# Patient Record
Sex: Male | Born: 1990 | Race: White | Hispanic: No | Marital: Single | Smoking: Never smoker
Health system: Southern US, Community
[De-identification: ages and names within clinical notes are randomized; demographics above are authoritative.]

## PROBLEM LIST (undated history)

## (undated) DIAGNOSIS — F988 Other specified behavioral and emotional disorders with onset usually occurring in childhood and adolescence: Secondary | ICD-10-CM

## (undated) DIAGNOSIS — L709 Acne, unspecified: Secondary | ICD-10-CM

## (undated) HISTORY — DX: Other specified behavioral and emotional disorders with onset usually occurring in childhood and adolescence: F98.8

## (undated) HISTORY — DX: Acne, unspecified: L70.9

---
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
## 2010-01-01 ENCOUNTER — Ambulatory Visit: Payer: Self-pay | Admitting: Internal Medicine

## 2010-01-01 DIAGNOSIS — J020 Streptococcal pharyngitis: Secondary | ICD-10-CM | POA: Insufficient documentation

## 2010-04-02 NOTE — Progress Notes (Signed)
  Phone Note Refill Request Message from:  Fax from Pharmacy on August 05, 2009 2:53 PM  Refills Requested: Medication #1:  ADDERALL XR 30 MG  CP24 1 by mouth once daily - to fill November 1 can pt have refill. No ov since 2009, please Advise  Initial call taken by: Ami Bullins CMA,  August 05, 2009 2:53 PM  Follow-up for Phone Call        ok to ref sch ov Follow-up by: Tresa Garter MD,  August 05, 2009 3:07 PM  Additional Follow-up for Phone Call Additional follow up Details #1::        Rx printed for MD to sign.  Additional Follow-up by: Ami Bullins CMA,  August 05, 2009 3:20 PM    Additional Follow-up for Phone Call Additional follow up Details #2::    see office visit on 08/07/2009 Follow-up by: Ami Bullins CMA,  August 27, 2009 8:54 AM  Prescriptions: ADDERALL XR 30 MG  CP24 (AMPHETAMINE-DEXTROAMPHETAMINE) 1 by mouth once daily - to fill December 30, 2008  #30 x 0   Entered by:   Ami Bullins CMA   Authorized by:   Tresa Garter MD   Signed by:   Bill Salinas CMA on 08/05/2009   Method used:   Print then Give to Patient   RxID:   [id number]

## 2010-04-02 NOTE — Assessment & Plan Note (Signed)
Summary: DR AVP PT/NO SLOT-COUGH-SORE THROAT/WHITE THROAT BLISTERS-STC   Vital Signs:  Patient profile:   20 year old male Height:      67 inches Weight:      184 pounds BMI:     28.92 O2 Sat:      96 % on Room air Temp:     98.5 degrees F oral Pulse rate:   68 / minute Pulse rhythm:   regular Resp:     16 per minute BP sitting:   106 / 80  (left arm) Cuff size:   large  Vitals Entered By: Rock Nephew CMA (January 01, 2010 10:06 AM)  Nutrition Counseling: Patient's BMI is greater than 25 and therefore counseled on weight management options.  O2 Flow:  Room air CC: Patient c/o sore throat, Bilateral ear pain x 2day, URI symptoms Is Patient Diabetic? No Pain Assessment Patient in pain? yes     Location: throat Intensity: 3  Does patient need assistance? Functional Status Self care Ambulation Normal   Primary Care Deeanna Beightol:  Georgina Quint Plotnikov MD  CC:  Patient c/o sore throat, Bilateral ear pain x 2day, and URI symptoms.  History of Present Illness:  URI Symptoms      This is a 20 year old man who presents with URI symptoms.  The symptoms began 2 days ago.  The severity is described as moderate.  The patient reports sore throat, but denies nasal congestion, clear nasal discharge, purulent nasal discharge, dry cough, productive cough, earache, and sick contacts.  Associated symptoms include low-grade fever (<100.5 degrees), use of an antipyretic, and response to antipyretic.  The patient denies stiff neck, dyspnea, wheezing, rash, vomiting, and diarrhea.  The patient denies headache, muscle aches, and severe fatigue.  Risk factors for Strep sinusitis include absence of cough.  The patient denies the following risk factors for Strep sinusitis: unilateral facial pain, unilateral nasal discharge, poor response to decongestant, and double sickening.    Preventive Screening-Counseling & Management  Alcohol-Tobacco     Alcohol drinks/day: 0     Alcohol Counseling: not  indicated; patient does not drink     Smoking Status: never     Tobacco Counseling: not indicated; no tobacco use  Hep-HIV-STD-Contraception     Hepatitis Risk: no risk noted     HIV Risk: no risk noted     STD Risk: no risk noted  Medications Prior to Update: 1)  Adderall Xr 30 Mg  Cp24 (Amphetamine-Dextroamphetamine) .Marland Kitchen.. 1 By Mouth Once Daily Please,  Fill On or After   10/07/09         . Ok To Fill 1-2 Day Earlier When The Month Has 31 Days in It. 2)  Omeprazole 20 Mg Cpdr (Omeprazole) .Marland Kitchen.. 1 By Mouth Once Daily 3)  Azithromycin 250 Mg Tabs (Azithromycin) .... 2po Qd For 1 Day, Then 1po Qd For 4days, Then Stop 4)  Mytussin Ac 100-10 Mg/68ml Syrp (Guaifenesin-Codeine) .... 5-10 Ml By Mouth Qid As Needed For Cough  Current Medications (verified): 1)  Adderall Xr 30 Mg  Cp24 (Amphetamine-Dextroamphetamine) .Marland Kitchen.. 1 By Mouth Once Daily Please,  Fill On or After   10/07/09         . Ok To Fill 1-2 Day Earlier When The Month Has 31 Days in It. 2)  Omeprazole 20 Mg Cpdr (Omeprazole) .Marland Kitchen.. 1 By Mouth Once Daily 3)  Amoxicillin 500 Mg Cap (Amoxicillin) .... Take 1 Capsule By Mouth Three Times A Day X 10 Days  Allergies (  verified): No Known Drug Allergies  Past History:  Past Medical History: Last updated: 04/17/2007 ADD Acne  Past Surgical History: Last updated: 03/24/2009 Denies surgical history  Family History: Last updated: 04/17/2007 F COPD  Social History: Last updated: 08/07/2009 Parents are divorced; lives with: M .  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 F COPD  Social History: Reviewed history from 08/07/2009 and no changes required. Parents are divorced; lives with: M .  Single Never  Smoked Alcohol use-no Drug use-no Regular exercise-golf Occupation: Architectural technologist Business  Review of Systems  The patient denies anorexia, fever, hoarseness, headaches, hemoptysis, abdominal pain, suspicious skin lesions, and enlarged lymph nodes.    Physical Exam  General:  alert, well-developed, well-nourished, well-hydrated, appropriate dress, normal appearance, healthy-appearing, and good hygiene.   Head:  normocephalic, atraumatic, no abnormalities observed, and no abnormalities palpated.   Eyes:  vision grossly intact and no injection.   Ears:  R ear normal and L ear normal.   Mouth:  good dentition, no exudates, no posterior lymphoid hypertrophy, no postnasal drip, no pharyngeal crowing, no lesions, no erosions, no tongue abnormalities, no leukoplakia, no petechiae, and pharyngeal erythema.   Neck:  supple, full ROM, no masses, no thyromegaly, no JVD, normal carotid upstroke, no carotid bruits, no cervical lymphadenopathy, and no neck tenderness.   Lungs:  normal respiratory effort, no intercostal retractions, no accessory muscle use, normal breath sounds, no dullness, no fremitus, no crackles, and no wheezes.   Heart:  normal rate, regular rhythm, no murmur, no gallop, no rub, and no JVD.   Abdomen:  soft, non-tender, normal bowel sounds, no distention, no masses, no guarding, no rigidity, no rebound tenderness, no abdominal hernia, no inguinal hernia, no hepatomegaly, and no splenomegaly.   Msk:  No deformity or scoliosis noted of thoracic or lumbar spine.   Pulses:  R and L carotid,radial,femoral,dorsalis pedis and posterior tibial pulses are full and equal bilaterally Extremities:  No clubbing, cyanosis, edema, or deformity noted with normal full range of motion of all joints.   Neurologic:  No cranial nerve deficits noted. Station and gait are normal. Plantar reflexes are down-going bilaterally. DTRs are symmetrical throughout. Sensory, motor and coordinative  functions appear intact. Skin:  turgor normal, color normal, no rashes, no suspicious lesions, no ecchymoses, no petechiae, no purpura, no ulcerations, and no edema.   Cervical Nodes:  no anterior cervical adenopathy and no posterior cervical adenopathy.   Axillary Nodes:  no R axillary adenopathy and no L axillary adenopathy.   Inguinal Nodes:  no R inguinal adenopathy and no L inguinal adenopathy.   Psych:  Cognition and judgment appear intact. Alert and cooperative with normal attention span and concentration. No apparent delusions, illusions, hallucinations   Impression & Recommendations:  Problem # 1:  STREP THROAT (ICD-034.0) Assessment New  The following medications were removed from the medication list:    Azithromycin 250 Mg Tabs (Azithromycin) .Marland Kitchen... 2po qd for 1 day, then 1po qd for 4days, then stop His updated medication list for this problem includes:    Amoxicillin 500 Mg Cap (Amoxicillin) .Marland Kitchen... Take 1 capsule by mouth three times a day x 10 days  Instructed to complete antibiotics and call if not improved in 48 hours.   Orders: Rapid Strep (  21308)  Problem # 2:  ADD (ICD-314.00) Assessment: Unchanged  Complete Medication List: 1)  Adderall Xr 30 Mg Cp24 (Amphetamine-dextroamphetamine) .Marland Kitchen.. 1 by mouth once daily please,  fill on or after   10/07/09         . ok to fill 1-2 day earlier when the month has 31 days in it. 2)  Omeprazole 20 Mg Cpdr (Omeprazole) .Marland Kitchen.. 1 by mouth once daily 3)  Amoxicillin 500 Mg Cap (Amoxicillin) .... Take 1 capsule by mouth three times a day x 10 days  Patient Instructions: 1)  Please schedule a follow-up appointment in 2 weeks. 2)  Get plenty of rest, drink lots of clear liquids, and use Tylenol or Ibuprofen for fever and comfort. Return in 7-10 days if you're not better:sooner if you're feeling worse. 3)  Take your antibiotic as prescribed until ALL of it is gone, but stop if you develop a rash or swelling and contact our office as soon as  possible. Prescriptions: ADDERALL XR 30 MG  CP24 (AMPHETAMINE-DEXTROAMPHETAMINE) 1 by mouth once daily Please,  fill on or after   10/07/09         . OK to fill 1-2 day earlier when the month has 31 days in it.  #30 x 0   Entered and Authorized by:   Etta Grandchild MD   Signed by:   Etta Grandchild MD on 01/01/2010   Method used:   Print then Give to Patient   RxID:   [id number] AMOXICILLIN 500 MG CAP (AMOXICILLIN) Take 1 capsule by mouth three times a day X 10 days  #30 x 0   Entered and Authorized by:   Etta Grandchild MD   Signed by:   Etta Grandchild MD on 01/01/2010   Method used:   Electronically to        [facility] [id number] (retail)       [address] [place], [place]  [address]       Ph: [phone]       Fax: [phone]   RxID:   [id number]    Orders Added: 1)  Est. Patient Level IV [01601] 2)  Rapid Strep [09323]    Laboratory Results    Other Tests  Rapid Strep: positive

## 2010-04-02 NOTE — Assessment & Plan Note (Signed)
Summary: follow up for rx refill-lb   Vital Signs:  Patient profile:   20 year old male Height:      67 inches Weight:      183.25 pounds BMI:     28.80 O2 Sat:      98 % on Room air Temp:     97.7 degrees F oral Pulse rate:   55 / minute BP sitting:   116 / 72  (left arm) Cuff size:   regular  Vitals Entered By: Lucious Groves (August 07, 2009 8:14 AM)  O2 Flow:  Room air CC: Follow-up visit/rx refill./kb Is Patient Diabetic? No Pain Assessment Patient in pain? no      Comments Patient notes that the only med he is taking is Adderall./kb   Primary Care Provider:  Tresa Garter MD  CC:  Follow-up visit/rx refill./kb.  History of Present Illness: F/u ADD  Current Medications (verified): 1)  Adderall Xr 30 Mg  Cp24 (Amphetamine-Dextroamphetamine) .Marland Kitchen.. 1 By Mouth Once Daily - To Fill December 30, 2008 2)  Omeprazole 20 Mg Cpdr (Omeprazole) .Marland Kitchen.. 1 By Mouth Once Daily 3)  Azithromycin 250 Mg Tabs (Azithromycin) .... 2po Qd For 1 Day, Then 1po Qd For 4days, Then Stop 4)  Mytussin Ac 100-10 Mg/32ml Syrp (Guaifenesin-Codeine) .... 5-10 Ml By Mouth Qid As Needed For Cough  Allergies (verified): No Known Drug Allergies  Past History:  Past Medical History: Last updated: 04/17/2007 ADD Acne  Past Surgical History: Last updated: 03/24/2009 Denies surgical history  Family History: Last updated: 04/17/2007 F COPD  Social History: Parents are divorced; lives with: M .  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         No LBP  Physical Exam  General:  alert, well-developed, well-nourished, well-hydrated, appropriate dress, normal appearance, healthy-appearing, cooperative to examination, good hygiene, and not overweight-appearing.   Eyes:  No corneal or conjunctival inflammation noted. EOMI. Perrla.    Mouth:  Oral mucosa and oropharynx without lesions or exudates.  Teeth in good repair. Lungs:  normal respiratory effort, no intercostal retractions, no accessory muscle use, normal breath sounds, no dullness, no crackles, and no wheezes.   Heart:  normal rate, regular rhythm, no murmur, no gallop, no rub, and no JVD.   Abdomen:  soft, non-tender, normal bowel sounds, no distention, no masses, no guarding, no rigidity, no hepatomegaly, and no splenomegaly.   Msk:  Marked decrease in LS spine ROM; no scoliosis, normal gait, normal posture Neurologic:  No cranial nerve deficits noted. Station and gait are normal. Plantar reflexes are down-going bilaterally. DTRs are symmetrical throughout. Sensory, motor and coordinative functions appear intact. Skin:  Intact without suspicious lesions or rashes Psych:  Cognition and judgment appear intact. Alert and cooperative with normal attention span and concentration. No apparent delusions, illusions, hallucinations   Impression & Recommendations:  Problem # 1:  ADD (ICD-314.00) Assessment Comment Only On prescription drug  therapy - Rx given  Problem # 2:  Stiff LS spine Assessment: Unchanged Use stretching and balance exercises that I have provided (15 min. or longer every day)   Complete Medication List: 1)  Adderall Xr 30 Mg Cp24 (Amphetamine-dextroamphetamine) .Marland Kitchen.. 1 by mouth once daily please,  fill on or after   10/07/09         . ok to fill 1-2 day earlier when the  month has 31 days in it. 2)  Omeprazole 20 Mg Cpdr (Omeprazole) .Marland Kitchen.. 1 by mouth once daily 3)  Azithromycin 250 Mg Tabs (Azithromycin) .... 2po qd for 1 day, then 1po qd for 4days, then stop 4)  Mytussin Ac 100-10 Mg/63ml Syrp (Guaifenesin-codeine) .... 5-10 ml by mouth qid as needed for cough  Patient Instructions: 1)  Please schedule a follow-up appointment in 6 months well w/labs. 2)  Use stretching and balance exercises that I have provided (15 min. or longer every  day) Prescriptions: ADDERALL XR 30 MG  CP24 (AMPHETAMINE-DEXTROAMPHETAMINE) 1 by mouth once daily Please,  fill on or after   10/07/09         . OK to fill 1-2 day earlier when the month has 31 days in it.  #30 x 0   Entered and Authorized by:   Tresa Garter MD   Signed by:   Tresa Garter MD on 08/07/2009   Method used:   Print then Give to Patient   RxID:   [id number] ADDERALL XR 30 MG  CP24 (AMPHETAMINE-DEXTROAMPHETAMINE) 1 by mouth once daily Please,  fill on or after   09/06/09         . OK to fill 1-2 day earlier when the month has 31 days in it.  #30 x 0   Entered and Authorized by:   Tresa Garter MD   Signed by:   Tresa Garter MD on 08/07/2009   Method used:   Print then Give to Patient   RxID:   [id number] ADDERALL XR 30 MG  CP24 (AMPHETAMINE-DEXTROAMPHETAMINE) 1 by mouth once daily  #30 x 0   Entered and Authorized by:   Tresa Garter MD   Signed by:   Tresa Garter MD on 08/07/2009   Method used:   Print then Give to Patient   RxID:   [id number]

## 2010-04-02 NOTE — Assessment & Plan Note (Signed)
Summary: sinus,cough/plot/cd  #  RE'D PER FATHER   Vital Signs:  Patient profile:   20 year old male Height:      67 inches Weight:      197 pounds O2 Sat:      98 % on Room air Temp:     97.9 degrees F oral Pulse rate:   80 / minute Pulse rhythm:   regular Resp:     16 per minute BP sitting:   106 / 80  (right arm)  O2 Flow:  Room air  Primary Care Provider:  Tresa Garter MD  CC:  URI symptoms.  History of Present Illness:  URI Symptoms      This is an 20 year old man who presents with URI symptoms.  The symptoms began 5 days ago.  The severity is described as moderate.  The patient reports purulent nasal discharge, sore throat, productive cough, and sick contacts, but denies earache.  The patient denies fever, stiff neck, dyspnea, wheezing, rash, vomiting, diarrhea, and use of an antipyretic.  The patient denies headache, muscle aches, and severe fatigue.  Risk factors for Strep sinusitis include poor response to decongestant and double sickening.  The patient denies the following risk factors for Strep sinusitis: unilateral facial pain, unilateral nasal discharge, Strep exposure, tender adenopathy, and absence of cough.    Preventive Screening-Counseling & Management  Alcohol-Tobacco     Alcohol drinks/day: 0     Smoking Status: never  Caffeine-Diet-Exercise     Does Patient Exercise: no  Hep-HIV-STD-Contraception     Hepatitis Risk: no risk noted     HIV Risk: no risk noted     STD Risk: no risk noted      Drug Use:  no.    Medications Prior to Update: 1)  Adderall Xr 30 Mg  Cp24 (Amphetamine-Dextroamphetamine) .Marland Kitchen.. 1 By Mouth Once Daily - To Fill December 30, 2008 2)  Omeprazole 20 Mg Cpdr (Omeprazole) .Marland Kitchen.. 1 By Mouth Once Daily 3)  Azithromycin 250 Mg Tabs (Azithromycin) .... 2po Qd For 1 Day, Then 1po Qd For 4days, Then Stop  Allergies (verified): No Known Drug Allergies  Past History:  Past Medical History: Reviewed history from 04/17/2007 and no  changes required. ADD Acne  Past Surgical History: Denies surgical history  Family History: Reviewed history from 04/17/2007 and no changes required. F COPD  Social History: Parents are divorced; lives with: M .  Single Never Smoked Alcohol use-no Drug use-no Regular exercise-no Hepatitis Risk:  no risk noted HIV Risk:  no risk noted STD Risk:  no risk noted Drug Use:  no Does Patient Exercise:  no  Review of Systems  The patient denies anorexia, fever, hoarseness, chest pain, hemoptysis, abdominal pain, difficulty walking, depression, enlarged lymph nodes, angioedema, and breast masses.    Physical Exam  General:  alert, well-developed, well-nourished, well-hydrated, appropriate dress, normal appearance, healthy-appearing, cooperative to examination, good hygiene, and overweight-appearing.   Head:  normocephalic, atraumatic, no abnormalities observed, and no abnormalities palpated.   Ears:  R ear normal and L ear normal.   Nose:  nasal discharge, mucosal erythema, and mucosal edema.  no nasal discharge, no airflow obstruction, no intranasal foreign body, no nasal polyps, no nasal mucosal lesions, no mucosal friability, no active bleeding or clots, no sinus percussion tenderness, no septum abnormalities, nasal dischargemucosal pallor, mucosal erythema, and mucosal edema.   Mouth:  no exudates, no posterior lymphoid hypertrophy, no postnasal drip, no pharyngeal crowing, no lesions, and pharyngeal  erythema.   Neck:  supple, full ROM, no masses, no thyromegaly, no JVD, no carotid bruits, and no cervical lymphadenopathy.   Lungs:  normal respiratory effort, no intercostal retractions, no accessory muscle use, normal breath sounds, no dullness, no crackles, and no wheezes.   Heart:  normal rate, regular rhythm, no murmur, no gallop, no rub, and no JVD.   Abdomen:  soft, non-tender, normal bowel sounds, no distention, no masses, no guarding, no rigidity, no hepatomegaly, and no  splenomegaly.   Msk:  no scoliosis, normal gait, normal posture Pulses:  R and L carotid,radial,femoral,dorsalis pedis and posterior tibial pulses are full and equal bilaterally Extremities:  no edema, no ulcers  Neurologic:  Neurologic exam grossly intact  Skin:  turgor normal, color normal, no rashes, no suspicious lesions, no ecchymoses, no petechiae, and no edema.   Cervical Nodes:  no anterior cervical adenopathy and no posterior cervical adenopathy.   Axillary Nodes:  no R axillary adenopathy and no L axillary adenopathy.   Inguinal Nodes:  no R inguinal adenopathy and no L inguinal adenopathy.   Psych:  alert and cooperative  Not depressed Notr suicidal or homicidal   Impression & Recommendations:  Problem # 1:  SINUSITIS- ACUTE-NOS (ICD-461.9) Assessment Deteriorated  His updated medication list for this problem includes:    Azithromycin 250 Mg Tabs (Azithromycin) .Marland Kitchen... 2po qd for 1 day, then 1po qd for 4days, then stop    Mytussin Ac 100-10 Mg/71ml Syrp (Guaifenesin-codeine) .Marland Kitchen... 5-10 ml by mouth qid as needed for cough  Problem # 2:  ADD (ICD-314.00) Assessment: Unchanged  Complete Medication List: 1)  Adderall Xr 30 Mg Cp24 (Amphetamine-dextroamphetamine) .Marland Kitchen.. 1 by mouth once daily - to fill December 30, 2008 2)  Omeprazole 20 Mg Cpdr (Omeprazole) .Marland Kitchen.. 1 by mouth once daily 3)  Azithromycin 250 Mg Tabs (Azithromycin) .... 2po qd for 1 day, then 1po qd for 4days, then stop 4)  Mytussin Ac 100-10 Mg/23ml Syrp (Guaifenesin-codeine) .... 5-10 ml by mouth qid as needed for cough  Patient Instructions: 1)  Please schedule a follow-up appointment in 1 month. 2)  Take your antibiotic as prescribed until ALL of it is gone, but stop if you develop a rash or swelling and contact our office as soon as possible. 3)  Acute bronchitis symptoms for less than 10 days are not helped by antibiotics. take over the counter cough medications. call if no improvment in  5-7 days, sooner if  increasing cough, fever, or new symptoms( shortness of breath, chest pain). Prescriptions: ADDERALL XR 30 MG  CP24 (AMPHETAMINE-DEXTROAMPHETAMINE) 1 by mouth once daily - to fill December 30, 2008  #30 x 0   Entered and Authorized by:   Etta Grandchild MD   Signed by:   Etta Grandchild MD on 03/24/2009   Method used:   Print then Give to Patient   RxID:   [id number] MYTUSSIN AC 100-10 MG/5ML SYRP (GUAIFENESIN-CODEINE) 5-10 ml by mouth QID as needed for cough  #6 ounces x 0   Entered and Authorized by:   Etta Grandchild MD   Signed by:   Etta Grandchild MD on 03/24/2009   Method used:   Print then Give to Patient   RxID:   [id number] AZITHROMYCIN 250 MG TABS (AZITHROMYCIN) 2po qd for 1 day, then 1po qd for 4days, then stop  #6 x 1   Entered and Authorized by:   Etta Grandchild MD   Signed by:   Etta Grandchild  MD on 03/24/2009   Method used:   Print then Give to Patient   RxID:   [id number]

## 2010-07-08 ENCOUNTER — Telehealth: Payer: Self-pay | Admitting: *Deleted

## 2010-07-08 MED ORDER — AMPHETAMINE-DEXTROAMPHET ER 30 MG PO CP24: 30.0000 mg | ORAL_CAPSULE | ORAL | Status: DC

## 2010-07-08 NOTE — Telephone Encounter (Signed)
Thx

## 2010-07-08 NOTE — Telephone Encounter (Signed)
Needs RFs, OK per MD 2 rx's and pt needs f/u OV. Rx's given to pt's father

## 2010-08-21 ENCOUNTER — Encounter: Payer: Self-pay | Admitting: Internal Medicine

## 2010-08-24 ENCOUNTER — Ambulatory Visit: Payer: Self-pay | Admitting: Internal Medicine

## 2011-01-25 ENCOUNTER — Telehealth: Payer: Self-pay | Admitting: *Deleted

## 2011-01-25 NOTE — Telephone Encounter (Signed)
OK to fill this prescription with additional refills x0, sch OV. Thank you!

## 2011-01-25 NOTE — Telephone Encounter (Signed)
Pt's father left vm req Rf on Adderall. He states he has been told pt must be seen but the schedulers told him no openings for some time. Please advise.

## 2011-01-26 MED ORDER — AMPHETAMINE-DEXTROAMPHET ER 30 MG PO CP24: 30.0000 mg | ORAL_CAPSULE | ORAL | Status: DC

## 2011-01-26 NOTE — Telephone Encounter (Signed)
Patient dad notified per MD

## 2011-07-06 ENCOUNTER — Telehealth: Payer: Self-pay | Admitting: Internal Medicine

## 2011-07-06 NOTE — Telephone Encounter (Signed)
He needs to be seen Thx

## 2011-07-06 NOTE — Telephone Encounter (Signed)
Father requesting adderall prescription  And pt will pick up prescription--

## 2011-07-07 NOTE — Telephone Encounter (Signed)
Noted. Thx.

## 2011-07-07 NOTE — Telephone Encounter (Signed)
Pt have appt August 04, 2011

## 2011-07-08 NOTE — Telephone Encounter (Signed)
Pt's father calling for Refill on Adderall. Please advise.

## 2011-07-09 ENCOUNTER — Ambulatory Visit (INDEPENDENT_AMBULATORY_CARE_PROVIDER_SITE_OTHER): Payer: BC Managed Care – PPO | Admitting: Internal Medicine

## 2011-07-09 ENCOUNTER — Encounter: Payer: Self-pay | Admitting: Internal Medicine

## 2011-07-09 VITALS — BP 98/72 | HR 76 | Temp 98.3°F | Resp 16 | Ht 69.0 in | Wt 180.0 lb

## 2011-07-09 DIAGNOSIS — F988 Other specified behavioral and emotional disorders with onset usually occurring in childhood and adolescence: Secondary | ICD-10-CM

## 2011-07-09 DIAGNOSIS — K219 Gastro-esophageal reflux disease without esophagitis: Secondary | ICD-10-CM

## 2011-07-09 DIAGNOSIS — M25569 Pain in unspecified knee: Secondary | ICD-10-CM

## 2011-07-09 MED ORDER — AMPHETAMINE-DEXTROAMPHETAMINE 10 MG PO TABS: 10.0000 mg | ORAL_TABLET | Freq: Two times a day (BID) | ORAL | Status: DC

## 2011-07-09 NOTE — Telephone Encounter (Signed)
His las ov was 08/07/2009  The pt called this morning still requesting the medicine.  He states he needs the medicine before his scheduled appt.

## 2011-07-09 NOTE — Progress Notes (Signed)
  Subjective:    Patient ID: Gary Jennings, male    DOB: February 26, 1991, 21 y.o.   MRN: [id number]  HPI  F/u ADD, GERD C/o sleeping difficulties when taking adderral xr C/o R knee pain w/running- wants to start running again  Wt Readings from Last 3 Encounters:  07/09/11 180 lb (81.647 kg)  01/01/10 184 lb (83.462 kg) (84.08%*)  08/07/09 183 lb 4 oz (83.122 kg) (84.60%*)   * Growth percentiles are based on CDC 2-20 Years data.      Review of Systems  Constitutional: Negative for appetite change, fatigue and unexpected weight change.  HENT: Negative for nosebleeds, congestion, sore throat, sneezing, trouble swallowing and neck pain.   Eyes: Negative for itching and visual disturbance.  Respiratory: Negative for cough.   Cardiovascular: Negative for chest pain, palpitations and leg swelling.  Gastrointestinal: Negative for nausea, diarrhea, blood in stool and abdominal distention.  Genitourinary: Negative for frequency and hematuria.  Musculoskeletal: Negative for back pain, joint swelling and gait problem.  Skin: Negative for rash.  Neurological: Negative for dizziness, tremors, speech difficulty and weakness.  Psychiatric/Behavioral: Positive for decreased concentration. Negative for suicidal ideas, behavioral problems, sleep disturbance, self-injury, dysphoric mood and agitation. The patient is not nervous/anxious and is not hyperactive.        Objective:   Physical Exam  Constitutional: He is oriented to person, place, and time. He appears well-developed and well-nourished. No distress.  HENT:  Mouth/Throat: Oropharynx is clear and moist.  Eyes: Conjunctivae are normal. Pupils are equal, round, and reactive to light.  Neck: Normal range of motion. No JVD present. No thyromegaly present.  Cardiovascular: Normal rate, regular rhythm, normal heart sounds and intact distal pulses.  Exam reveals no gallop and no friction rub.   No murmur heard. Pulmonary/Chest: Effort normal and  breath sounds normal. No respiratory distress. He has no wheezes. He exhibits no tenderness.  Abdominal: Soft. Bowel sounds are normal. He exhibits no distension and no mass. There is no tenderness. There is no rebound and no guarding.  Musculoskeletal: Normal range of motion. He exhibits no edema and no tenderness.       R knee w/crepitus w/ROM NT  Lymphadenopathy:    He has no cervical adenopathy.  Neurological: He is alert and oriented to person, place, and time. He has normal reflexes. No cranial nerve deficit. He exhibits normal muscle tone. Coordination normal.  Skin: Skin is warm and dry. No rash noted.  Psychiatric: He has a normal mood and affect. His behavior is normal. Judgment and thought content normal.          Assessment & Plan:

## 2011-07-09 NOTE — Telephone Encounter (Signed)
Lvmom for pt

## 2011-07-09 NOTE — Assessment & Plan Note (Signed)
Sports Med cons Dr Darrick Penna Ibuprofen prn Knee strengthening

## 2011-07-09 NOTE — Telephone Encounter (Signed)
Unable to fill w/o appt- he has not been seen in 2 years Thx

## 2011-07-09 NOTE — Assessment & Plan Note (Signed)
He stopped Prilosec

## 2011-07-09 NOTE — Telephone Encounter (Signed)
I could work him in 4 pm today Thx

## 2011-07-09 NOTE — Telephone Encounter (Signed)
When was his last ov? Thx

## 2011-07-09 NOTE — Assessment & Plan Note (Signed)
There is some difficulties sleeping w/Adderal XR Will switch to regular Adderral

## 2011-07-20 ENCOUNTER — Ambulatory Visit (INDEPENDENT_AMBULATORY_CARE_PROVIDER_SITE_OTHER): Payer: BC Managed Care – PPO | Admitting: Family Medicine

## 2011-07-20 VITALS — BP 100/70 | Ht 68.0 in | Wt 170.0 lb

## 2011-07-20 DIAGNOSIS — M25569 Pain in unspecified knee: Secondary | ICD-10-CM

## 2011-07-20 DIAGNOSIS — M25561 Pain in right knee: Secondary | ICD-10-CM | POA: Insufficient documentation

## 2011-07-20 MED ORDER — DICLOFENAC SODIUM 75 MG PO TBEC: 75.0000 mg | DELAYED_RELEASE_TABLET | Freq: Two times a day (BID) | ORAL | Status: DC

## 2011-07-20 NOTE — Assessment & Plan Note (Addendum)
Suspect overuse injury with patellar tendinitis and pes anserine tendinopathy from faster than recommended increase in running regimen. Patient's right leg is also 1.5 cm shorter than left, which may have made him more susceptible to injury.  Recommend patellar Helix, icing, limited running for the next couple of weeks, NSAID prn. Patient given heel inserts and heel lifts from right foot.  Follow-up in 4 weeks.   LL may contribute - if improves, then a custom orthotic for long-term use may be helpful.

## 2011-07-20 NOTE — Patient Instructions (Addendum)
You may walk for golf, however, avoiding running for the next 1-2 weeks.   Take the voltaren up to twice a day as needed. Icing after activity is important.   The patellar helix may help your knee pain.  Use the cushioned insoles and the heel lift for your right foot.   Follow-up in 1 month.

## 2011-07-20 NOTE — Progress Notes (Signed)
  Subjective:    Patient ID: Gary Jennings, male    DOB: 1991-01-13, 21 y.o.   MRN: [id number]  HPI This is a 21 year old healthy male presenting with worsening right knee pain.  He has had knee pain for the past 7-8 months.  Activity-wise, he used to run 2-3 miles a day and plays a lot of golf, but he usually uses the cart.  The pain is located below his patella. It hurts him the most the day after a run. He has stopped running for the past 2 weeks because of the pain.  He has not tried any medication or other treatment for the pain.  He denies any history of trauma to his knee.   Ramped up running rapidly with no formal program  Review of Systems    Objective:   Physical Exam GEN: WDWN, NAD, Non-toxic, Alert & Oriented x 3 HEENT: Atraumatic, Normocephalic.  Ears and Nose: No external deformity. EXTR: No clubbing/cyanosis/edema NEURO: Normal gait.  PSYCH: Normally interactive. Conversant. Not depressed or anxious appearing.  Calm demeanor.   Knee:  Right  Gait: Normal heel toe pattern ROM: intact Effusion: neg Echymosis or edema: none Patellar tendon mild tenderness also along pes anserine and PT distally Patellar grind: negative Medial and lateral patellar facet loading: negative medial and lateral joint lines:NT Mcmurray's neg Varus and valgus stress: stable Lachman: neg Ant and Post drawer: neg Hip abduction, IR, ER: WNL Hip flexion str: 5/5 Hip abd: 5/5 Quad: 5/5 VMO atrophy:No Hamstring concentric and eccentric: 5/5  Length length discrepancy: L 99.5 cm, R 98.0 cm from umbilicus to inferior medial malleolus  Ultrasound of left knee No tears or edema. Increased Doppler signal around pes anserine/patellar tendon.    Assessment & Plan:

## 2011-08-04 ENCOUNTER — Ambulatory Visit: Payer: Self-pay | Admitting: Internal Medicine

## 2011-11-16 ENCOUNTER — Telehealth: Payer: Self-pay | Admitting: Internal Medicine

## 2011-11-16 NOTE — Telephone Encounter (Signed)
OK OV in 3 mo Thx

## 2011-11-16 NOTE — Telephone Encounter (Signed)
Dad is requesting 3 mth prescription for pt's adderall---will pick prescription up from this office--dad  /don  [place] phone:  [phone]

## 2011-11-17 MED ORDER — AMPHETAMINE-DEXTROAMPHETAMINE 10 MG PO TABS: 10.0000 mg | ORAL_TABLET | Freq: Two times a day (BID) | ORAL | Status: DC

## 2011-11-17 NOTE — Telephone Encounter (Signed)
Pt scheduled visit for December, will come by today to pick up 3 month RX

## 2011-11-17 NOTE — Telephone Encounter (Signed)
RX printed and pt informed to pick up after 12pm

## 2011-12-03 ENCOUNTER — Ambulatory Visit (INDEPENDENT_AMBULATORY_CARE_PROVIDER_SITE_OTHER): Payer: BC Managed Care – PPO | Admitting: Internal Medicine

## 2011-12-03 ENCOUNTER — Other Ambulatory Visit (INDEPENDENT_AMBULATORY_CARE_PROVIDER_SITE_OTHER): Payer: BC Managed Care – PPO

## 2011-12-03 ENCOUNTER — Encounter: Payer: Self-pay | Admitting: Internal Medicine

## 2011-12-03 VITALS — BP 110/90 | HR 88 | Temp 98.1°F | Resp 16 | Wt 179.0 lb

## 2011-12-03 DIAGNOSIS — R1032 Left lower quadrant pain: Secondary | ICD-10-CM

## 2011-12-03 DIAGNOSIS — R0789 Other chest pain: Secondary | ICD-10-CM | POA: Insufficient documentation

## 2011-12-03 DIAGNOSIS — M25569 Pain in unspecified knee: Secondary | ICD-10-CM

## 2011-12-03 DIAGNOSIS — R071 Chest pain on breathing: Secondary | ICD-10-CM

## 2011-12-03 LAB — URINALYSIS
Specific Gravity, Urine: 1.025 (ref 1.000–1.030)
Total Protein, Urine: NEGATIVE
Urine Glucose: NEGATIVE
pH: 6 (ref 5.0–8.0)

## 2011-12-03 MED ORDER — CLINDAMYCIN PHOSPHATE 1 % EX LOTN: TOPICAL_LOTION | Freq: Two times a day (BID) | CUTANEOUS | Status: DC

## 2011-12-03 MED ORDER — DICLOFENAC SODIUM 75 MG PO TBEC: 75.0000 mg | DELAYED_RELEASE_TABLET | Freq: Two times a day (BID) | ORAL | Status: DC

## 2011-12-03 MED ORDER — AMPHETAMINE-DEXTROAMPHETAMINE 10 MG PO TABS: 10.0000 mg | ORAL_TABLET | Freq: Two times a day (BID) | ORAL | Status: DC

## 2011-12-03 NOTE — Patient Instructions (Signed)
Call if not well 

## 2011-12-03 NOTE — Progress Notes (Signed)
   Subjective:    Patient ID: Gary Jennings, male    DOB: [birth year], 21 y.o.   MRN: [id number]  Abdominal Pain This is a new problem. The current episode started in the past 7 days. The onset quality is gradual. The problem occurs intermittently. Duration: a few seconds. The problem has been gradually worsening. The pain is located in the LLQ and left flank. The pain is at a severity of 7/10. The pain is moderate. The quality of the pain is sharp. The abdominal pain radiates to the LLQ and left flank. Pertinent negatives include no diarrhea, frequency, hematuria or nausea. Exacerbated by: stretching. The pain is relieved by certain positions. He has tried nothing for the symptoms.    F/u ADD, GERD  C/o R knee pain w/running- wants to start running again  Wt Readings from Last 3 Encounters:  12/03/11 179 lb (81.194 kg)  07/20/11 170 lb (77.111 kg)  07/09/11 180 lb (81.647 kg)      Review of Systems  Constitutional: Negative for appetite change, fatigue and unexpected weight change.  HENT: Negative for nosebleeds, congestion, sore throat, sneezing, trouble swallowing and neck pain.   Eyes: Negative for itching and visual disturbance.  Respiratory: Negative for cough.   Cardiovascular: Negative for chest pain, palpitations and leg swelling.  Gastrointestinal: Positive for abdominal pain. Negative for nausea, diarrhea, blood in stool and abdominal distention.  Genitourinary: Negative for frequency and hematuria.  Musculoskeletal: Negative for back pain, joint swelling and gait problem.  Skin: Negative for rash.  Neurological: Negative for dizziness, tremors, speech difficulty and weakness.  Psychiatric/Behavioral: Positive for decreased concentration. Negative for suicidal ideas, behavioral problems, disturbed wake/sleep cycle, self-injury, dysphoric mood and agitation. The patient is not nervous/anxious and is not hyperactive.        Objective:   Physical Exam  Constitutional: He is  oriented to person, place, and time. He appears well-developed and well-nourished. No distress.  HENT:  Mouth/Throat: Oropharynx is clear and moist.  Eyes: Conjunctivae normal are normal. Pupils are equal, round, and reactive to light.  Neck: Normal range of motion. No JVD present. No thyromegaly present.  Cardiovascular: Normal rate, regular rhythm, normal heart sounds and intact distal pulses.  Exam reveals no gallop and no friction rub.   No murmur heard. Pulmonary/Chest: Effort normal and breath sounds normal. No respiratory distress. He has no wheezes. He exhibits no tenderness.  Abdominal: Soft. Bowel sounds are normal. He exhibits no distension and no mass. There is no tenderness. There is no rebound and no guarding.  Musculoskeletal: Normal range of motion. He exhibits no edema and no tenderness.       R knee w/crepitus w/ROM NT  Lymphadenopathy:    He has no cervical adenopathy.  Neurological: He is alert and oriented to person, place, and time. He has normal reflexes. No cranial nerve deficit. He exhibits normal muscle tone. Coordination normal.  Skin: Skin is warm and dry. No rash noted.  Psychiatric: He has a normal mood and affect. His behavior is normal. Judgment and thought content normal.  L lat chest wall and L abd wall is tender        Assessment & Plan:

## 2011-12-05 ENCOUNTER — Encounter: Payer: Self-pay | Admitting: Internal Medicine

## 2011-12-05 NOTE — Assessment & Plan Note (Signed)
Resolved

## 2011-12-05 NOTE — Assessment & Plan Note (Signed)
UA Call if not better NSAID Stretch and warm up prior to golfing 

## 2011-12-05 NOTE — Assessment & Plan Note (Signed)
UA Call if not better NSAID Stretch and warm up prior to golfing

## 2012-02-16 ENCOUNTER — Ambulatory Visit: Payer: BC Managed Care – PPO | Admitting: Internal Medicine

## 2012-02-16 DIAGNOSIS — Z0289 Encounter for other administrative examinations: Secondary | ICD-10-CM

## 2012-03-07 ENCOUNTER — Ambulatory Visit: Payer: BC Managed Care – PPO | Admitting: Internal Medicine

## 2012-05-29 ENCOUNTER — Ambulatory Visit: Payer: BC Managed Care – PPO | Admitting: Internal Medicine

## 2012-05-29 ENCOUNTER — Ambulatory Visit (INDEPENDENT_AMBULATORY_CARE_PROVIDER_SITE_OTHER): Payer: BC Managed Care – PPO | Admitting: Internal Medicine

## 2012-05-29 ENCOUNTER — Encounter: Payer: Self-pay | Admitting: Internal Medicine

## 2012-05-29 VITALS — BP 120/80 | HR 76 | Temp 98.1°F | Resp 16 | Wt 186.0 lb

## 2012-05-29 DIAGNOSIS — H109 Unspecified conjunctivitis: Secondary | ICD-10-CM | POA: Insufficient documentation

## 2012-05-29 DIAGNOSIS — J019 Acute sinusitis, unspecified: Secondary | ICD-10-CM

## 2012-05-29 DIAGNOSIS — N390 Urinary tract infection, site not specified: Secondary | ICD-10-CM

## 2012-05-29 DIAGNOSIS — F988 Other specified behavioral and emotional disorders with onset usually occurring in childhood and adolescence: Secondary | ICD-10-CM

## 2012-05-29 MED ORDER — AMPHETAMINE-DEXTROAMPHETAMINE 10 MG PO TABS: 10.0000 mg | ORAL_TABLET | Freq: Two times a day (BID) | ORAL | Status: DC

## 2012-05-29 MED ORDER — CEFUROXIME AXETIL 500 MG PO TABS: 500.0000 mg | ORAL_TABLET | Freq: Two times a day (BID) | ORAL | Status: DC

## 2012-05-29 MED ORDER — POLYMYXIN B-TRIMETHOPRIM 10000-0.1 UNIT/ML-% OP SOLN: 1.0000 [drp] | OPHTHALMIC | Status: DC

## 2012-05-29 NOTE — Progress Notes (Signed)
  Subjective:    Patient ID: Gary Jennings, male    DOB: [birth year], 22 y.o.   MRN: [id number]  C/o L pink eye. He stopped using contacts C/o cold sx's x 2 wks - brown nasal d/c  Conjunctivitis  The current episode started 5 to 7 days ago (L eye). The onset was sudden. The problem has been unchanged. The problem is moderate. Nothing relieves the symptoms. Pertinent negatives include no fever, no decreased vision, no double vision, no eye itching, no diarrhea, no nausea, no congestion, no sore throat, no neck pain, no cough, no rash, no eye discharge and no eye pain.    F/u ADD, GERD   Wt Readings from Last 3 Encounters:  05/29/12 186 lb (84.369 kg)  12/03/11 179 lb (81.194 kg)  07/20/11 170 lb (77.111 kg)      Review of Systems  Constitutional: Negative for fever, appetite change, fatigue and unexpected weight change.  HENT: Negative for nosebleeds, congestion, sore throat, sneezing, trouble swallowing and neck pain.   Eyes: Negative for double vision, pain, discharge, itching and visual disturbance.  Respiratory: Negative for cough.   Cardiovascular: Negative for chest pain, palpitations and leg swelling.  Gastrointestinal: Negative for nausea, diarrhea, blood in stool and abdominal distention.  Genitourinary: Negative for frequency and hematuria.  Musculoskeletal: Negative for back pain, joint swelling and gait problem.  Skin: Negative for rash.  Neurological: Negative for dizziness, tremors, speech difficulty and weakness.  Psychiatric/Behavioral: Positive for decreased concentration. Negative for suicidal ideas, behavioral problems, sleep disturbance, self-injury, dysphoric mood and agitation. The patient is not nervous/anxious and is not hyperactive.        Objective:   Physical Exam  Constitutional: He is oriented to person, place, and time. He appears well-developed and well-nourished. No distress.  HENT:  Mouth/Throat: No oropharyngeal exudate.  eryth nasal mucosa   Eyes: Conjunctivae are normal. Right eye exhibits no discharge. Left eye exhibits no discharge. No scleral icterus.  L eye is red, NT  Neck: Normal range of motion. No JVD present. No thyromegaly present.  Cardiovascular: Normal rate, regular rhythm, normal heart sounds and intact distal pulses.  Exam reveals no gallop and no friction rub.   No murmur heard. Pulmonary/Chest: Effort normal and breath sounds normal. No respiratory distress. He has no wheezes. He exhibits no tenderness.  Abdominal: Soft. Bowel sounds are normal. He exhibits no distension and no mass. There is no tenderness. There is no rebound and no guarding.  Musculoskeletal: Normal range of motion. He exhibits no edema and no tenderness.  R knee w/crepitus w/ROM NT  Lymphadenopathy:    He has no cervical adenopathy.  Neurological: He is alert and oriented to person, place, and time. He has normal reflexes. No cranial nerve deficit. He exhibits normal muscle tone. Coordination normal.  Skin: Skin is warm and dry. No rash noted.  Psychiatric: He has a normal mood and affect. His behavior is normal. Judgment and thought content normal.          Assessment & Plan:

## 2012-05-29 NOTE — Assessment & Plan Note (Signed)
3/14 L No contact lense in the eye Polytrim gtt

## 2012-05-29 NOTE — Assessment & Plan Note (Signed)
Ceftin x10 d 

## 2012-05-29 NOTE — Assessment & Plan Note (Signed)
Better Continue with current prescription therapy as reflected on the Med list.  

## 2012-05-30 ENCOUNTER — Telehealth: Payer: Self-pay | Admitting: Internal Medicine

## 2012-05-30 ENCOUNTER — Ambulatory Visit: Payer: BC Managed Care – PPO | Admitting: Family Medicine

## 2012-05-30 NOTE — Telephone Encounter (Signed)
Called to request different antibiotic be ordered for conjunctivis.  Seen in office 05/29/12.  Started on antibiotic eye drops and Cefuroxime BID.  Had vomiting and stomach cramps occurred within 2 hours of first dose so stopped taking it. No vomiting, stomach cramps or diarrhea today.  Please advise if new oral antibiotic is called in. [facility]/ [address] and [address]. Informed MD may not see message until 05/31/12.

## 2012-05-31 MED ORDER — AMOXICILLIN-POT CLAVULANATE 875-125 MG PO TABS: 1.0000 | ORAL_TABLET | Freq: Two times a day (BID) | ORAL | Status: DC

## 2012-05-31 NOTE — Telephone Encounter (Signed)
Sorry! -D/c Ceftin Take Augmentin - emailed Is the eye better? Thx

## 2012-06-01 ENCOUNTER — Ambulatory Visit (INDEPENDENT_AMBULATORY_CARE_PROVIDER_SITE_OTHER): Payer: BC Managed Care – PPO | Admitting: Family Medicine

## 2012-06-01 ENCOUNTER — Encounter: Payer: Self-pay | Admitting: Family Medicine

## 2012-06-01 VITALS — BP 125/80 | HR 68 | Ht 68.0 in | Wt 180.0 lb

## 2012-06-01 DIAGNOSIS — S93409A Sprain of unspecified ligament of unspecified ankle, initial encounter: Secondary | ICD-10-CM

## 2012-06-01 DIAGNOSIS — S93401A Sprain of unspecified ligament of right ankle, initial encounter: Secondary | ICD-10-CM | POA: Insufficient documentation

## 2012-06-01 NOTE — Assessment & Plan Note (Signed)
tenderness reproduced over ATFL consistent with sprain, likely grade 2.  Has an ASO at home - encouraged to use this.  Icing, nsaids, elevation.  Start ROM exercises and in a week add strengthening, balance exercises.  Anticipate 2-4 weeks for total improvement.  F/u in 4 weeks if still struggling.

## 2012-06-01 NOTE — Progress Notes (Signed)
  Subjective:    Patient ID: Gary Jennings, male    DOB: 12-19-1990, 22 y.o.   MRN: [id number]  PCP: Dr. Posey Rea  HPI 22 yo M here for right ankle injury.  Patient reports on 3/31 while playing basketball he shifted to the right and inverted his right ankle. Had to stop playing due to pain. + swelling and bruising. Improved since then. Pop was heard when injury occurred. Taking tylenol, icing. Limping still. About 8 years ago injured this ankle - was in a boot but does not recall a fracture. Had improved completely from this.  Past Medical History  Diagnosis Date  . ADD (attention deficit disorder)   . Acne     Current Outpatient Prescriptions on File Prior to Visit  Medication Sig Dispense Refill  . amoxicillin-clavulanate (AUGMENTIN) 875-125 MG per tablet Take 1 tablet by mouth 2 (two) times daily.  20 tablet  0  . amphetamine-dextroamphetamine (ADDERALL) 10 MG tablet Take 1 tablet (10 mg total) by mouth 2 (two) times daily. Please fill on or after 08/16/12  60 tablet  0  . clindamycin (CLEOCIN T) 1 % lotion Apply topically 2 (two) times daily. For acne  60 mL  2  . trimethoprim-polymyxin b (POLYTRIM) ophthalmic solution Place 1 drop into the left eye every 4 (four) hours.  10 mL  0   No current facility-administered medications on file prior to visit.    History reviewed. No pertinent past surgical history.  Allergies  Allergen Reactions  . Ceftin (Cefuroxime Axetil)     n/v    History   Social History  . Marital Status: Single    Spouse Name: N/A    Number of Children: N/A  . Years of Education: N/A   Occupational History  . Not on file.   Social History Main Topics  . Smoking status: Never Smoker   . Smokeless tobacco: Not on file  . Alcohol Use: No  . Drug Use: No  . Sexually Active: Not on file   Other Topics Concern  . Not on file   Social History Narrative   Parents are devoiced; lives with mother   Single   Regular exercise-[place]   Occupation: Therapist, [employer] and [employer]    Family History  Problem Relation Age of Onset  . COPD Father     BP 125/80  Pulse 68  Ht 5\' 8"  (1.727 m)  Wt 180 lb (81.647 kg)  BMI 27.38 kg/m2 Review of Systems See HPI above.    Objective:   Physical Exam Gen: NAD  R ankle: Mod swelling laterally.  No bruising, other deformity. Mild limitation ROM all directions. TTP over ATFL.  Minimal tenderness medial malleolus.  No lateral malleolus, base 5th, navicular, other ankle TTP.  No fibular head TTP. 2+ talar tilt.  1+ ant drawer.  Negative syndesmotic compression. Thompsons test negative. NV intact distally.    Assessment & Plan:  1. Right ankle sprain - tenderness reproduced over ATFL consistent with sprain, likely grade 2.  Has an ASO at home - encouraged to use this.  Icing, nsaids, elevation.  Start ROM exercises and in a week add strengthening, balance exercises.  Anticipate 2-4 weeks for total improvement.  F/u in 4 weeks if still struggling.

## 2012-06-01 NOTE — Patient Instructions (Addendum)
You have an ankle sprain. Ice the area for 15 minutes at a time, 3-4 times a day Take aleve 1-2 tabs twice a day with food as needed for pain and inflammation. Elevate above the level of your heart when possible Use laceup ankle brace that you have for support and compression (ok to use ace wrap instead). Come out of the boot/brace twice a day to do Up/down and alphabet exercises 2-3 sets of each. After a week add the strengthening exercises with theraband and the balance exercises we showed - do daily for next 6 weeks. Return to work should be completely fine on Monday. Start with putting and chipping Monday and every 3 days add in more clubs (short irons, then long irons, then woods). If not improving as expected, we may repeat x-rays or consider further testing like an MRI. Follow up with me in 4 weeks or as needed.

## 2012-06-01 NOTE — Telephone Encounter (Signed)
Pt informed of MD's advisement and new ABX for Augmentin via VM and to callback office with any questions/concerns.

## 2012-07-05 ENCOUNTER — Telehealth: Payer: Self-pay | Admitting: Internal Medicine

## 2012-07-05 NOTE — Telephone Encounter (Signed)
Emailed Psychologist, occupational, will notify father when correction has been made

## 2012-07-05 NOTE — Telephone Encounter (Signed)
The patient's dad came in and is hoping to get the 50.00 no show fee removed from his son's account.  He stated the son was unaware of the apt.  The dad is hoping for a call when the no-show fee is removed.  Thanks!   [phone] (Dad - Kara Dies)

## 2012-07-06 NOTE — Telephone Encounter (Signed)
Notified father that No-show has been removed

## 2013-01-19 ENCOUNTER — Encounter: Payer: Self-pay | Admitting: Internal Medicine

## 2013-01-19 ENCOUNTER — Ambulatory Visit (INDEPENDENT_AMBULATORY_CARE_PROVIDER_SITE_OTHER): Payer: BC Managed Care – PPO | Admitting: Internal Medicine

## 2013-01-19 VITALS — BP 100/70 | HR 76 | Temp 98.8°F | Resp 16 | Wt 177.0 lb

## 2013-01-19 DIAGNOSIS — F988 Other specified behavioral and emotional disorders with onset usually occurring in childhood and adolescence: Secondary | ICD-10-CM

## 2013-01-19 MED ORDER — AMPHETAMINE-DEXTROAMPHET ER 30 MG PO CP24: 30.0000 mg | ORAL_CAPSULE | ORAL | Status: DC

## 2013-01-19 NOTE — Progress Notes (Signed)
Pre visit review using our clinic review tool, if applicable. No additional management support is needed unless otherwise documented below in the visit note. 

## 2013-01-20 ENCOUNTER — Encounter: Payer: Self-pay | Admitting: Internal Medicine

## 2013-01-20 NOTE — Progress Notes (Signed)
  Subjective:     HPI  F/u ADD No sleeping difficulties now   Wt Readings from Last 3 Encounters:  01/19/13 177 lb (80.287 kg)  06/01/12 180 lb (81.647 kg)  05/29/12 186 lb (84.369 kg)    BP Readings from Last 3 Encounters:  01/19/13 100/70  06/01/12 125/80  05/29/12 120/80     Review of Systems  Constitutional: Negative for appetite change, fatigue and unexpected weight change.  HENT: Negative for congestion, nosebleeds, sneezing, sore throat and trouble swallowing.   Eyes: Negative for itching and visual disturbance.  Respiratory: Negative for cough.   Cardiovascular: Negative for chest pain, palpitations and leg swelling.  Gastrointestinal: Negative for nausea, diarrhea, blood in stool and abdominal distention.  Genitourinary: Negative for frequency and hematuria.  Musculoskeletal: Negative for back pain, gait problem, joint swelling and neck pain.  Skin: Negative for rash.  Neurological: Negative for dizziness, tremors, speech difficulty and weakness.  Psychiatric/Behavioral: Positive for decreased concentration. Negative for suicidal ideas, behavioral problems, sleep disturbance, self-injury, dysphoric mood and agitation. The patient is not nervous/anxious and is not hyperactive.        Objective:   Physical Exam  Constitutional: He is oriented to person, place, and time. He appears well-developed and well-nourished. No distress.  HENT:  Mouth/Throat: Oropharynx is clear and moist.  Eyes: Conjunctivae are normal. Pupils are equal, round, and reactive to light.  Neck: Normal range of motion. No JVD present. No thyromegaly present.  Cardiovascular: Normal rate, regular rhythm, normal heart sounds and intact distal pulses.  Exam reveals no gallop and no friction rub.   No murmur heard. Pulmonary/Chest: Effort normal and breath sounds normal. No respiratory distress. He has no wheezes. He exhibits no tenderness.  Abdominal: Soft. Bowel sounds are normal. He exhibits no  distension and no mass. There is no tenderness. There is no rebound and no guarding.  Musculoskeletal: Normal range of motion. He exhibits no edema and no tenderness.  R knee w/crepitus w/ROM NT  Lymphadenopathy:    He has no cervical adenopathy.  Neurological: He is alert and oriented to person, place, and time. He has normal reflexes. No cranial nerve deficit. He exhibits normal muscle tone. Coordination normal.  Skin: Skin is warm and dry. No rash noted.  Psychiatric: He has a normal mood and affect. His behavior is normal. Judgment and thought content normal.    No results found for this basename: WBC, HGB, HCT, PLT, GLUCOSE, CHOL, TRIG, HDL, LDLDIRECT, LDLCALC, ALT, AST, NA, K, CL, CREATININE, BUN, CO2, TSH, PSA, INR, GLUF, HGBA1C, MICROALBUR         Assessment & Plan:

## 2013-01-20 NOTE — Assessment & Plan Note (Signed)
Discussed See meds 

## 2014-08-23 ENCOUNTER — Encounter: Payer: Self-pay | Admitting: Internal Medicine

## 2014-08-23 ENCOUNTER — Ambulatory Visit (INDEPENDENT_AMBULATORY_CARE_PROVIDER_SITE_OTHER): Payer: BLUE CROSS/BLUE SHIELD | Admitting: Internal Medicine

## 2014-08-23 VITALS — BP 108/70 | HR 52 | Temp 97.8°F | Resp 16 | Wt 167.0 lb

## 2014-08-23 DIAGNOSIS — Z202 Contact with and (suspected) exposure to infections with a predominantly sexual mode of transmission: Secondary | ICD-10-CM | POA: Diagnosis not present

## 2014-08-23 MED ORDER — AZITHROMYCIN 250 MG PO TABS: ORAL_TABLET | ORAL | Status: DC

## 2014-08-23 MED ORDER — GEMIFLOXACIN MESYLATE 320 MG PO TABS: 320.0000 mg | ORAL_TABLET | Freq: Every day | ORAL | Status: DC

## 2014-08-23 NOTE — Progress Notes (Signed)
Pre visit review using our clinic review tool, if applicable. No additional management support is needed unless otherwise documented below in the visit note. 
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
## 2014-08-23 NOTE — Patient Instructions (Signed)
Fill the  prescription for antibiotic if fever,pain, penile discharge appear.

## 2014-09-11 ENCOUNTER — Telehealth: Payer: Self-pay | Admitting: Geriatric Medicine

## 2014-09-11 NOTE — Telephone Encounter (Signed)
Spoke to pt. Request confirmation that [place] pharmacy to [place] rx gemifloxacin. Pt confirmed that he did request. Next, pharmacy contacted us and stated that they were not able to fill the rx b/c they did not have the med available. Informed pt that he could call another pharmacy and check availability with them and have the rx transferred. Offered our assistance if he was unable to find a pharmacy at his out of town location to fill the rx.

## 2014-09-12 MED ORDER — LEVOFLOXACIN 500 MG PO TABS: 500.0000 mg | ORAL_TABLET | Freq: Every day | ORAL | Status: DC

## 2014-09-12 NOTE — Telephone Encounter (Signed)
Pt contacted. Erx done.

## 2014-09-12 NOTE — Telephone Encounter (Signed)
Change it to Levaquin 500 mg qd #7 thanks

## 2014-09-12 NOTE — Addendum Note (Signed)
Addended by: CAIRRIKIER, Dicie Edelen M on: 09/12/2014 04:22 PM   Modules accepted: Orders  

## 2014-09-12 NOTE — Telephone Encounter (Signed)
[facility] not [facility] order the Factive [facility]. The equivalents that I can find are quinolones type antibiotics. Contacted the pharmacy and they do have the ciprofloxacin.

## 2014-09-12 NOTE — Telephone Encounter (Signed)
Patient called stating that [facility] can't fill the prescription because of the drug being so old. And they don't carry it. Can you please prescribe something else for him Please advise patient

## 2014-09-13 ENCOUNTER — Telehealth: Payer: Self-pay

## 2014-09-13 NOTE — Telephone Encounter (Signed)
New rx sent to pt pharmacy preference. This one should be all done.

## 2014-09-13 NOTE — Telephone Encounter (Signed)
[place] but it has discontinued by manufacture. She would like to know if you want to send in something different or just cancel script and just have patient to take amoxicillin only?

## 2014-09-13 NOTE — Telephone Encounter (Signed)
Check with Stefanie; Levaquin was ordered yesterday

## 2015-08-06 ENCOUNTER — Encounter: Payer: BLUE CROSS/BLUE SHIELD | Admitting: Internal Medicine

## 2015-08-08 ENCOUNTER — Ambulatory Visit: Payer: BLUE CROSS/BLUE SHIELD | Admitting: Internal Medicine

## 2015-08-08 ENCOUNTER — Encounter: Payer: Self-pay | Admitting: Internal Medicine

## 2015-08-08 ENCOUNTER — Other Ambulatory Visit: Payer: BLUE CROSS/BLUE SHIELD

## 2015-08-08 ENCOUNTER — Ambulatory Visit (INDEPENDENT_AMBULATORY_CARE_PROVIDER_SITE_OTHER): Payer: BLUE CROSS/BLUE SHIELD | Admitting: Internal Medicine

## 2015-08-08 VITALS — BP 118/80 | HR 50 | Wt 173.0 lb

## 2015-08-08 DIAGNOSIS — B081 Molluscum contagiosum: Secondary | ICD-10-CM | POA: Insufficient documentation

## 2015-08-08 DIAGNOSIS — Z7721 Contact with and (suspected) exposure to potentially hazardous body fluids: Secondary | ICD-10-CM | POA: Diagnosis not present

## 2015-08-08 MED ORDER — CIPROFLOXACIN HCL 500 MG PO TABS: 500.0000 mg | ORAL_TABLET | Freq: Two times a day (BID) | ORAL | Status: DC

## 2015-08-08 MED ORDER — DOXYCYCLINE HYCLATE 100 MG PO TABS: 100.0000 mg | ORAL_TABLET | Freq: Two times a day (BID) | ORAL | Status: DC

## 2015-08-08 NOTE — Progress Notes (Signed)
Pre visit review using our clinic review tool, if applicable. No additional management support is needed unless otherwise documented below in the visit note. 

## 2015-08-08 NOTE — Assessment & Plan Note (Addendum)
Abd wall Discussed  Info/Rx options given

## 2015-08-08 NOTE — Assessment & Plan Note (Addendum)
Labs Cipro and Doxy Safe sex discussed

## 2015-08-09 ENCOUNTER — Encounter: Payer: Self-pay | Admitting: Internal Medicine

## 2015-08-09 LAB — GC/CHLAMYDIA PROBE AMP
CT PROBE, AMP APTIMA: NOT DETECTED
GC PROBE AMP APTIMA: NOT DETECTED

## 2015-08-09 NOTE — Progress Notes (Signed)
Subjective:  Patient ID: Gary Jennings, male    DOB: Dec 22, 1990  Age: 25 y.o. MRN: [id number]  CC: No chief complaint on file.   HPI Gary Jennings presents for chlamydia exposure in 3/17 (no sx's). Info per his ex-GF. C/o rash on abdomen  Outpatient Prescriptions Prior to Visit  Medication Sig Dispense Refill  . azithromycin (ZITHROMAX) 250 MG tablet 4 pills taken together (Patient not taking: Reported on 08/08/2015) 4 tablet 0  . gemifloxacin (FACTIVE) 320 MG tablet Take 1 tablet (320 mg total) by mouth daily. (Patient not taking: Reported on 08/08/2015) 1 tablet 0  . levofloxacin (LEVAQUIN) 500 MG tablet Take 1 tablet (500 mg total) by mouth daily. (Patient not taking: Reported on 08/08/2015) 7 tablet 0   No facility-administered medications prior to visit.    ROS Review of Systems  Constitutional: Negative for appetite change, fatigue and unexpected weight change.  HENT: Negative for congestion, nosebleeds, sneezing, sore throat and trouble swallowing.   Eyes: Negative for itching and visual disturbance.  Respiratory: Negative for cough.   Cardiovascular: Negative for chest pain, palpitations and leg swelling.  Gastrointestinal: Negative for nausea, diarrhea, blood in stool and abdominal distention.  Genitourinary: Negative for dysuria, urgency, frequency, hematuria, decreased urine volume, penile swelling, difficulty urinating, genital sores, penile pain and testicular pain.  Musculoskeletal: Negative for back pain, joint swelling, gait problem and neck pain.  Skin: Positive for rash.  Neurological: Negative for dizziness, tremors, speech difficulty and weakness.  Psychiatric/Behavioral: Negative for sleep disturbance, dysphoric mood and agitation. The patient is not nervous/anxious.     Objective:  BP 118/80 mmHg  Pulse 50  Wt 173 lb (78.472 kg)  SpO2 97%  BP Readings from Last 3 Encounters:  08/08/15 118/80  08/23/14 108/70  01/19/13 100/70    Wt Readings from Last 3  Encounters:  08/08/15 173 lb (78.472 kg)  08/23/14 167 lb (75.751 kg)  01/19/13 177 lb (80.287 kg)    Physical Exam  Constitutional: He is oriented to person, place, and time. He appears well-developed. No distress.  NAD  HENT:  Mouth/Throat: Oropharynx is clear and moist.  Eyes: Conjunctivae are normal. Pupils are equal, round, and reactive to light.  Neck: Normal range of motion. No JVD present. No thyromegaly present.  Cardiovascular: Normal rate, regular rhythm, normal heart sounds and intact distal pulses.  Exam reveals no gallop and no friction rub.   No murmur heard. Pulmonary/Chest: Effort normal and breath sounds normal. No respiratory distress. He has no wheezes. He has no rales. He exhibits no tenderness.  Abdominal: Soft. Bowel sounds are normal. He exhibits no distension and no mass. There is no tenderness. There is no rebound and no guarding.  Musculoskeletal: Normal range of motion. He exhibits no edema or tenderness.  Lymphadenopathy:    He has no cervical adenopathy.  Neurological: He is alert and oriented to person, place, and time. He has normal reflexes. No cranial nerve deficit. He exhibits normal muscle tone. He displays a negative Romberg sign. Coordination and gait normal.  Skin: Skin is warm and dry. Rash noted.  Psychiatric: He has a normal mood and affect. His behavior is normal. Judgment and thought content normal.    No results found for: WBC, HGB, HCT, PLT, GLUCOSE, CHOL, TRIG, HDL, LDLDIRECT, LDLCALC, ALT, AST, NA, K, CL, CREATININE, BUN, CO2, TSH, PSA, INR, GLUF, HGBA1C, MICROALBUR  No results found.  Assessment & Plan:   Diagnoses and all orders for this visit:  Exposure to  potentially hazardous body fluids -     HIV antibody; Future -     GC/chlamydia probe amp, urine; Future -     Hepatitis C antibody; Future  Molluscum contagiosum  Other orders -     doxycycline (VIBRA-TABS) 100 MG tablet; Take 1 tablet (100 mg total) by mouth 2 (two) times  daily. -     ciprofloxacin (CIPRO) 500 MG tablet; Take 1 tablet (500 mg total) by mouth 2 (two) times daily.   I have discontinued Mr. Verna CzechBranch's gemifloxacin, azithromycin, and levofloxacin. I am also having him start on doxycycline and ciprofloxacin.  Meds ordered this encounter  Medications  . doxycycline (VIBRA-TABS) 100 MG tablet    Sig: Take 1 tablet (100 mg total) by mouth 2 (two) times daily.    Dispense:  20 tablet    Refill:  0  . ciprofloxacin (CIPRO) 500 MG tablet    Sig: Take 1 tablet (500 mg total) by mouth 2 (two) times daily.    Dispense:  8 tablet    Refill:  0     Follow-up: No Follow-up on file.  Sonda PrimesAlex Navin Dogan, MD

## 2016-03-16 ENCOUNTER — Ambulatory Visit (INDEPENDENT_AMBULATORY_CARE_PROVIDER_SITE_OTHER): Payer: BLUE CROSS/BLUE SHIELD | Admitting: Internal Medicine

## 2016-03-16 ENCOUNTER — Encounter: Payer: Self-pay | Admitting: Internal Medicine

## 2016-03-16 DIAGNOSIS — F988 Other specified behavioral and emotional disorders with onset usually occurring in childhood and adolescence: Secondary | ICD-10-CM

## 2016-03-16 DIAGNOSIS — G8929 Other chronic pain: Secondary | ICD-10-CM | POA: Diagnosis not present

## 2016-03-16 DIAGNOSIS — L659 Nonscarring hair loss, unspecified: Secondary | ICD-10-CM | POA: Diagnosis not present

## 2016-03-16 DIAGNOSIS — M25561 Pain in right knee: Secondary | ICD-10-CM

## 2016-03-16 MED ORDER — VITAMIN D3 50 MCG (2000 UT) PO CAPS: 2000.0000 [IU] | ORAL_CAPSULE | Freq: Every day | ORAL | 3 refills | Status: DC

## 2016-03-16 MED ORDER — AMPHETAMINE-DEXTROAMPHET ER 15 MG PO CP24
15.0000 mg | ORAL_CAPSULE | ORAL | 0 refills | Status: DC
Start: 2016-03-16 — End: 2016-03-16

## 2016-03-16 MED ORDER — AMPHETAMINE-DEXTROAMPHET ER 15 MG PO CP24: 15.0000 mg | ORAL_CAPSULE | ORAL | 0 refills | Status: DC

## 2016-03-16 MED ORDER — AMPHETAMINE-DEXTROAMPHET ER 15 MG PO CP24
15.0000 mg | ORAL_CAPSULE | ORAL | 0 refills | Status: DC
Start: 2016-03-16 — End: 2016-05-14

## 2016-03-16 NOTE — Progress Notes (Signed)
Pre visit review using our clinic review tool, if applicable. No additional management support is needed unless otherwise documented below in the visit note. 

## 2016-03-16 NOTE — Assessment & Plan Note (Signed)
Derm ref B complex, Biotin, Vitamin D and Rogaine

## 2016-03-16 NOTE — Assessment & Plan Note (Signed)
?  loose body Dr Katrinka BlazingSmith ref

## 2016-03-16 NOTE — Patient Instructions (Signed)
Try B complex, Biotin, Vitamin D and Rogaine

## 2016-03-16 NOTE — Progress Notes (Signed)
Subjective:  Patient ID: Gary Jennings, male    DOB: 08/27/1990  Age: 26 y.o. MRN: [id number]  CC: No chief complaint on file.   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 C/o R knee loose body inside.Marland Kitchen.Marland Kitchen.C/o hair loss  Outpatient Medications Prior to Visit  Medication Sig Dispense Refill  . ciprofloxacin (CIPRO) 500 MG tablet Take 1 tablet (500 mg total) by mouth 2 (two) times daily. 8 tablet 0  . doxycycline (VIBRA-TABS) 100 MG tablet Take 1 tablet (100 mg total) by mouth 2 (two) times daily. 20 tablet 0   No facility-administered medications prior to visit.     ROS Review of Systems  Constitutional: Negative for appetite change, fatigue and unexpected weight change.  HENT: Negative for congestion, nosebleeds, sneezing, sore throat and trouble swallowing.   Eyes: Negative for itching and visual disturbance.  Respiratory: Negative for cough.   Cardiovascular: Negative for chest pain, palpitations and leg swelling.  Gastrointestinal: Negative for abdominal distention, blood in stool, diarrhea and nausea.  Genitourinary: Negative for frequency and hematuria.  Musculoskeletal: Negative for back pain, gait problem, joint swelling and neck pain.  Skin: Negative for rash.  Neurological: Negative for dizziness, tremors, speech difficulty and weakness.  Psychiatric/Behavioral: Negative for agitation, dysphoric mood and sleep disturbance. The patient is not nervous/anxious.     Objective:  BP 120/78   Pulse 67   Temp 97.9 F (36.6 C) (Oral)   Wt 179 lb (81.2 kg)   SpO2 97%   BMI 27.22 kg/m   BP Readings from Last 3 Encounters:  03/16/16 120/78  08/08/15 118/80  08/23/14 108/70    Wt Readings from Last 3 Encounters:  03/16/16 179 lb (81.2 kg)  08/08/15 173 lb (78.5 kg)  08/23/14 167 lb (75.8 kg)    Physical Exam  Constitutional: He is oriented to person, place, and time. He appears well-developed. No distress.  NAD    HENT:  Mouth/Throat: Oropharynx is clear and moist.  Eyes: Conjunctivae are normal. Pupils are equal, round, and reactive to light.  Neck: Normal range of motion. No JVD present. No thyromegaly present.  Cardiovascular: Normal rate, regular rhythm, normal heart sounds and intact distal pulses.  Exam reveals no gallop and no friction rub.   No murmur heard. Pulmonary/Chest: Effort normal and breath sounds normal. No respiratory distress. He has no wheezes. He has no rales. He exhibits no tenderness.  Abdominal: Soft. Bowel sounds are normal. He exhibits no distension and no mass. There is no tenderness. There is no rebound and no guarding.  Musculoskeletal: Normal range of motion. He exhibits no edema or tenderness.  Lymphadenopathy:    He has no cervical adenopathy.  Neurological: He is alert and oriented to person, place, and time. He has normal reflexes. No cranial nerve deficit. He exhibits normal muscle tone. He displays a negative Romberg sign. Coordination and gait normal.  Skin: Skin is warm and dry. No rash noted.  Psychiatric: He has a normal mood and affect. His behavior is normal. Judgment and thought content normal.  R knee is swollen  No results found for: WBC, HGB, HCT, PLT, GLUCOSE, CHOL, TRIG, HDL, LDLDIRECT, LDLCALC, ALT, AST, NA, K, CL, CREATININE, BUN, CO2, TSH, PSA, INR, GLUF, HGBA1C, MICROALBUR  No results found.  Assessment & Plan:   There are no diagnoses linked to this encounter. I have discontinued Mr. Verna CzechBranch's doxycycline and ciprofloxacin.  No orders of the  defined types were placed in this encounter.    Follow-up: No Follow-up on file.  Walker Kehr, MD

## 2016-05-14 ENCOUNTER — Telehealth: Payer: Self-pay | Admitting: *Deleted

## 2016-05-14 MED ORDER — AMPHETAMINE-DEXTROAMPHET ER 15 MG PO CP24: 15.0000 mg | ORAL_CAPSULE | ORAL | 0 refills | Status: DC

## 2016-05-14 NOTE — Telephone Encounter (Signed)
Notified pt MD ok to replace script. Will leave upfront for pick-up...Raechel Chute/lmb

## 2016-05-14 NOTE — Telephone Encounter (Signed)
Ok to restore the Rx Thx

## 2016-05-14 NOTE — Telephone Encounter (Signed)
Pt states he has miss placed his last script for his Adderrall requesting another script to have fill...Raechel Chute/lmb

## 2016-05-17 MED ORDER — AMPHETAMINE-DEXTROAMPHET ER 15 MG PO CP24: 15.0000 mg | ORAL_CAPSULE | ORAL | 0 refills | Status: DC

## 2016-05-17 NOTE — Telephone Encounter (Signed)
Could not find script from Friday reprinted script & gave to pt myself...Raechel Chute/lmb

## 2016-05-17 NOTE — Addendum Note (Signed)
Addended by: Deatra JamesBRAND, LUCY M on: 05/17/2016 11:37 AM   Modules accepted: Orders

## 2016-06-11 ENCOUNTER — Ambulatory Visit (INDEPENDENT_AMBULATORY_CARE_PROVIDER_SITE_OTHER): Payer: BLUE CROSS/BLUE SHIELD | Admitting: Nurse Practitioner

## 2016-06-11 ENCOUNTER — Encounter: Payer: Self-pay | Admitting: Nurse Practitioner

## 2016-06-11 VITALS — BP 122/78 | HR 50 | Temp 98.3°F | Ht 69.0 in | Wt 176.0 lb

## 2016-06-11 DIAGNOSIS — R253 Fasciculation: Secondary | ICD-10-CM | POA: Diagnosis not present

## 2016-06-11 DIAGNOSIS — F988 Other specified behavioral and emotional disorders with onset usually occurring in childhood and adolescence: Secondary | ICD-10-CM

## 2016-06-11 MED ORDER — AMPHETAMINE-DEXTROAMPHET ER 15 MG PO CP24: 15.0000 mg | ORAL_CAPSULE | ORAL | 0 refills | Status: DC

## 2016-06-11 NOTE — Progress Notes (Signed)
Pre visit review using our clinic review tool, if applicable. No additional management support is needed unless otherwise documented below in the visit note. 

## 2016-06-11 NOTE — Progress Notes (Signed)
   Subjective:  Patient ID: Gary Jennings, male    DOB: 04-23-1990  Age: 26 y.o. MRN: [id number]  CC: Follow-up (weird feeling on right side cheek,numbness going on 2 days. )   HPI  Mr. Gastelum describes facial sensation of muscle twitching on right cheek, sensation is intermittent, no specific trigger, resolves spontaneously, last for about , no other associated symptoms. Denies any recent URI or headache. Denies any anxiety. No FH of parkinsons dx or muscular dystrophy.  ADD: Also requesting for Adderral prescription. He does understand that he can not fill prescription till 06/14/16.  Outpatient Medications Prior to Visit  Medication Sig Dispense Refill  . amphetamine-dextroamphetamine (ADDERALL XR) 15 MG 24 hr capsule Take 1 capsule by mouth every morning. 30 capsule 0  . Cholecalciferol (VITAMIN D3) 2000 units capsule Take 1 capsule (2,000 Units total) by mouth daily. (Patient not taking: Reported on 06/11/2016) 100 capsule 3   No facility-administered medications prior to visit.     ROS See HPI  Objective:  BP 122/78   Pulse (!) 50   Temp 98.3 F (36.8 C)   Ht  (1.753 m)   Wt 176 lb (79.8 kg)   SpO2 98%   BMI 25.99 kg/m   BP Readings from Last 3 Encounters:  06/11/16 122/78  03/16/16 120/78  08/08/15 118/80    Wt Readings from Last 3 Encounters:  06/11/16 176 lb (79.8 kg)  03/16/16 179 lb (81.2 kg)  08/08/15 173 lb (78.5 kg)    Physical Exam  Constitutional: He is oriented to person, place, and time. No distress.  HENT:  Head: Normocephalic.  Right Ear: Tympanic membrane, external ear and ear canal normal.  Left Ear: Tympanic membrane, external ear and ear canal normal.  Nose: Nose normal.  Mouth/Throat: Uvula is midline, oropharynx is clear and moist and mucous membranes are normal. No oral lesions. No trismus in the jaw.  Eyes: Conjunctivae and EOM are normal. Pupils are equal, round, and reactive to light. No scleral icterus.  Neck: Normal  range of motion. Neck supple. No thyromegaly present.  Pulmonary/Chest: Effort normal and breath sounds normal.  Musculoskeletal: He exhibits no edema or tenderness.  Lymphadenopathy:    He has no cervical adenopathy.  Neurological: He is alert and oriented to person, place, and time. No cranial nerve deficit. Coordination normal.  Skin: Skin is warm and dry. No erythema.  Vitals reviewed.   No results found for: WBC, HGB, HCT, PLT, GLUCOSE, CHOL, TRIG, HDL, LDLDIRECT, LDLCALC, ALT, AST, NA, K, CL, CREATININE, BUN, CO2, TSH, PSA, INR, GLUF, HGBA1C, MICROALBUR  No results found.  Assessment & Plan:   Rhet was seen today for follow-up.  Diagnoses and all orders for this visit:  Attention deficit disorder (ADD) without hyperactivity -     amphetamine-dextroamphetamine (ADDERALL XR) 15 MG 24 hr capsule; Take 1 capsule by mouth every morning.  Muscle twitching   I am having Mr. Nauta maintain his Vitamin D3 and amphetamine-dextroamphetamine.  Meds ordered this encounter  Medications  . amphetamine-dextroamphetamine (ADDERALL XR) 15 MG 24 hr capsule    Sig: Take 1 capsule by mouth every morning.    Dispense:  30 capsule    Refill:  0    Please fill on or after 06/14/16    Order Specific Question:   Supervising Provider    Answer:   Tresa Garter [[id number]]    Follow-up: Return if symptoms worsen or fail to improve.  Alysia Penna, NP

## 2016-06-11 NOTE — Patient Instructions (Signed)
Transient Ischemic Attack °A transient ischemic attack (TIA) is a "warning stroke" that causes stroke-like symptoms. Unlike a stroke, a TIA does not cause permanent damage to the brain. The symptoms of a TIA can happen very fast and do not last long. It is important to know the symptoms of a TIA and what to do. This can help prevent a major stroke or death. °What are the causes? °A TIA is caused by a temporary blockage in an artery in the brain or neck (carotid artery). The blockage does not allow the brain to get the blood supply it needs and can cause different symptoms. The blockage can be caused by either: °· A blood clot. °· Fatty buildup (plaque) in a neck or brain artery. ° °What increases the risk? °· High blood pressure (hypertension). °· High cholesterol. °· Diabetes mellitus. °· Heart disease. °· The buildup of plaque in the blood vessels (peripheral artery disease or atherosclerosis). °· The buildup of plaque in the blood vessels that provide blood and oxygen to the brain (carotid artery stenosis). °· An abnormal heart rhythm (atrial fibrillation). °· Obesity. °· Using any tobacco products, including cigarettes, chewing tobacco, or electronic cigarettes. °· Taking oral contraceptives, especially in combination with using tobacco. °· Physical inactivity. °· A diet high in fats, salt (sodium), and calories. °· Excessive alcohol use. °· Use of illegal drugs (especially cocaine and methamphetamine). °· Being male. °· Being African American. °· Being over the age of 55 years. °· Family history of stroke. °· Previous history of blood clots, stroke, TIA, or heart attack. °· Sickle cell disease. °What are the signs or symptoms? °TIA symptoms are the same as a stroke but are temporary. These symptoms usually develop suddenly, or may be newly present upon waking from sleep: °· Sudden weakness or numbness of the face, arm, or leg, especially on one side of the body. °· Sudden trouble walking or difficulty moving  arms or legs. °· Sudden confusion. °· Sudden personality changes. °· Trouble speaking (aphasia) or understanding. °· Difficulty swallowing. °· Sudden trouble seeing in one or both eyes. °· Double vision. °· Dizziness. °· Loss of balance or coordination. °· Sudden severe headache with no known cause. °· Trouble reading or writing. °· Loss of bowel or bladder control. °· Loss of consciousness. ° °How is this diagnosed? °Your health care provider may be able to determine the presence or absence of a TIA based on your symptoms, history, and physical exam. CT scan of the brain is usually performed to help identify a TIA. Other tests may include: °· Electrocardiography (ECG). °· Continuous heart monitoring. °· Echocardiography. °· Carotid ultrasonography. °· MRI. °· A scan of the brain circulation. °· Blood tests. ° °How is this treated? °Since the symptoms of TIA are the same as a stroke, it is important to seek treatment as soon as possible. You may need a medicine to dissolve a blood clot (thrombolytic) if that is the cause of the TIA. This medicine cannot be given if too much time has passed. Treatment may also include: °· Rest, oxygen, fluids through an IV tube, and medicines to thin the blood (anticoagulants). °· Measures will be taken to prevent short-term and long-term complications, including infection from breathing foreign material into the lungs (aspiration pneumonia), blood clots in the legs, and falls. °· Procedures to either remove plaque in the carotid arteries or dilate carotid arteries that have narrowed due to plaque. Those procedures are: °? Carotid endarterectomy. °? Carotid angioplasty and stenting. °· Medicines   and diet may be used to address diabetes, high blood pressure, and other underlying risk factors. ° °Follow these instructions at home: °· Take medicines only as directed by your health care provider. Follow the directions carefully. Medicines may be used to control risk factors for a stroke.  Be sure you understand all your medicine instructions. °· You may be told to take aspirin or the anticoagulant warfarin. Warfarin needs to be taken exactly as instructed. °? Taking too much or too little warfarin is dangerous. Too much warfarin increases the risk of bleeding. Too little warfarin continues to allow the risk for blood clots. While taking warfarin, you will need to have regular blood tests to measure your blood clotting time. A PT blood test measures how long it takes for blood to clot. Your PT is used to calculate another value called an INR. Your PT and INR help your health care provider to adjust your dose of warfarin. The dose can change for many reasons. It is critically important that you take warfarin exactly as prescribed. °? Many foods, especially foods high in vitamin K can interfere with warfarin and affect the PT and INR. Foods high in vitamin K include spinach, kale, broccoli, cabbage, collard and turnip greens, Brussels sprouts, peas, cauliflower, seaweed, and parsley, as well as beef and pork liver, green tea, and soybean oil. You should eat a consistent amount of foods high in vitamin K. Avoid major changes in your diet, or notify your health care provider before changing your diet. Arrange a visit with a dietitian to answer your questions. °? Many medicines can interfere with warfarin and affect the PT and INR. You must tell your health care provider about any and all medicines you take; this includes all vitamins and supplements. Be especially cautious with aspirin and anti-inflammatory medicines. Do not take or discontinue any prescribed or over-the-counter medicine except on the advice of your health care provider or pharmacist. °? Warfarin can have side effects, such as excessive bruising or bleeding. You will need to hold pressure over cuts for longer than usual. Your health care provider or pharmacist will discuss other potential side effects. °? Avoid sports or activities that  may cause injury or bleeding. °? Be careful when shaving, flossing your teeth, or handling sharp objects. °? Alcohol can change the body's ability to handle warfarin. It is best to avoid alcoholic drinks or consume only very small amounts while taking warfarin. Notify your health care provider if you change your alcohol intake. °? Notify your dentist or other health care providers before procedures. °· Eat a diet that includes 5 or more servings of fruits and vegetables each day. This may reduce the risk of stroke. Certain diets may be prescribed to address high blood pressure, high cholesterol, diabetes, or obesity. °? A diet low in sodium, saturated fat, trans fat, and cholesterol is recommended to manage high blood pressure. °? A diet low in saturated fat, trans fat, and cholesterol, and high in fiber may control cholesterol levels. °? A controlled-carbohydrate, controlled-sugar diet is recommended to manage diabetes. °? A reduced-calorie diet that is low in sodium, saturated fat, trans fat, and cholesterol is recommended to manage obesity. °· Maintain a healthy weight. °· Stay physically active. It is recommended that you get at least 30 minutes of activity on most or all days. °· Do not use any tobacco products, including cigarettes, chewing tobacco, or electronic cigarettes. If you need help quitting, ask your health care provider. °· Limit alcohol intake   to no more than 1 drink per day for nonpregnant women and 2 drinks per day for men. One drink equals 12 ounces of beer, 5 ounces of wine, or 1½ ounces of hard liquor. °· Do not abuse drugs. °· A safe home environment is important to reduce the risk of falls. Your health care provider may arrange for specialists to evaluate your home. Having grab bars in the bedroom and bathroom is often important. Your health care provider may arrange for equipment to be used at home, such as raised toilets and a seat for the shower. °· Follow all instructions for follow-up  with your health care provider. This is very important. This includes any referrals and lab tests. Proper follow-up can prevent a stroke or another TIA from occurring. °How is this prevented? °The risk of a TIA can be decreased by appropriately treating high blood pressure, high cholesterol, diabetes, heart disease, and obesity, and by quitting smoking, limiting alcohol, and staying physically active. °Contact a health care provider if: °· You have personality changes. °· You have difficulty swallowing. °· You are seeing double. °· You have dizziness. °· You have a fever. °Get help right away if: °Any of the following symptoms may represent a serious problem that is an emergency. Do not wait to see if the symptoms will go away. Get medical help right away. Call your local emergency services (911 in [place] Do not drive yourself to the hospital. °· You have sudden weakness or numbness of the face, arm, or leg, especially on one side of the body. °· You have sudden trouble walking or difficulty moving arms or legs. °· You have sudden confusion. °· You have trouble speaking (aphasia) or understanding. °· You have sudden trouble seeing in one or both eyes. °· You have a loss of balance or coordination. °· You have a sudden, severe headache with no known cause. °· You have new chest pain or an irregular heartbeat. °· You have a partial or total loss of consciousness. ° °This information is not intended to replace advice given to you by your health care provider. Make sure you discuss any questions you have with your health care provider. °Document Released: 11/25/2004 Document Revised: 10/20/2015 Document Reviewed: 05/23/2013 °Elsevier Interactive Patient Education © 2017 [employer] ° °

## 2016-06-16 ENCOUNTER — Encounter: Payer: BLUE CROSS/BLUE SHIELD | Admitting: Internal Medicine

## 2016-06-29 ENCOUNTER — Encounter: Payer: Self-pay | Admitting: Internal Medicine

## 2016-06-29 ENCOUNTER — Ambulatory Visit (INDEPENDENT_AMBULATORY_CARE_PROVIDER_SITE_OTHER): Payer: BLUE CROSS/BLUE SHIELD | Admitting: Internal Medicine

## 2016-06-29 VITALS — BP 118/78 | HR 45 | Temp 98.1°F | Ht 69.0 in | Wt 175.0 lb

## 2016-06-29 DIAGNOSIS — F988 Other specified behavioral and emotional disorders with onset usually occurring in childhood and adolescence: Secondary | ICD-10-CM | POA: Diagnosis not present

## 2016-06-29 DIAGNOSIS — F419 Anxiety disorder, unspecified: Secondary | ICD-10-CM | POA: Diagnosis not present

## 2016-06-29 DIAGNOSIS — L559 Sunburn, unspecified: Secondary | ICD-10-CM

## 2016-06-29 DIAGNOSIS — Z0000 Encounter for general adult medical examination without abnormal findings: Secondary | ICD-10-CM

## 2016-06-29 MED ORDER — AMPHETAMINE-DEXTROAMPHET ER 15 MG PO CP24: 15.0000 mg | ORAL_CAPSULE | ORAL | 0 refills | Status: DC

## 2016-06-29 MED ORDER — TRIAMCINOLONE ACETONIDE 0.1 % EX OINT: 1.0000 "application " | TOPICAL_OINTMENT | Freq: Three times a day (TID) | CUTANEOUS | 1 refills | Status: DC

## 2016-06-29 NOTE — Progress Notes (Signed)
Pre visit review using our clinic review tool, if applicable. No additional management support is needed unless otherwise documented below in the visit note. 

## 2016-06-29 NOTE — Progress Notes (Signed)
Subjective:  Patient ID: Gary Jennings, male    DOB: 12-04-1990  Age: 26 y.o. MRN: [id number]  CC: No chief complaint on file.   HPI Gary Jennings presents for well exam C/o anxiety episodes F/u ADD C/o sunburn  Outpatient Medications Prior to Visit  Medication Sig Dispense Refill  . amphetamine-dextroamphetamine (ADDERALL XR) 15 MG 24 hr capsule Take 1 capsule by mouth every morning. 30 capsule 0  . Cholecalciferol (VITAMIN D3) 2000 units capsule Take 1 capsule (2,000 Units total) by mouth daily. 100 capsule 3   No facility-administered medications prior to visit.     ROS Review of Systems  Constitutional: Negative for appetite change, fatigue and unexpected weight change.  HENT: Negative for congestion, nosebleeds, sneezing, sore throat and trouble swallowing.   Eyes: Negative for itching and visual disturbance.  Respiratory: Negative for cough.   Cardiovascular: Negative for chest pain, palpitations and leg swelling.  Gastrointestinal: Negative for abdominal distention, blood in stool, diarrhea and nausea.  Genitourinary: Negative for frequency and hematuria.  Musculoskeletal: Negative for back pain, gait problem, joint swelling and neck pain.  Skin: Positive for color change. Negative for rash.  Neurological: Negative for dizziness, tremors, speech difficulty and weakness.  Psychiatric/Behavioral: Negative for agitation, dysphoric mood, sleep disturbance and suicidal ideas. The patient is nervous/anxious.     Objective:  BP 118/78 (BP Location: Left Arm, Patient Position: Sitting, Cuff Size: Normal)   Pulse (!) 45   Temp 98.1 F (36.7 C) (Oral)   Ht  (1.753 m)   Wt 175 lb (79.4 kg)   SpO2 99%   BMI 25.84 kg/m   BP Readings from Last 3 Encounters:  06/29/16 118/78  06/11/16 122/78  03/16/16 120/78    Wt Readings from Last 3 Encounters:  06/29/16 175 lb (79.4 kg)  06/11/16 176 lb (79.8 kg)  03/16/16 179 lb (81.2 kg)    Physical Exam    Constitutional: He is oriented to person, place, and time. He appears well-developed. No distress.  NAD  HENT:  Mouth/Throat: Oropharynx is clear and moist.  Eyes: Conjunctivae are normal. Pupils are equal, round, and reactive to light.  Neck: Normal range of motion. No JVD present. No thyromegaly present.  Cardiovascular: Normal rate, regular rhythm, normal heart sounds and intact distal pulses.  Exam reveals no gallop and no friction rub.   No murmur heard. Pulmonary/Chest: Effort normal and breath sounds normal. No respiratory distress. He has no wheezes. He has no rales. He exhibits no tenderness.  Abdominal: Soft. Bowel sounds are normal. He exhibits no distension and no mass. There is no tenderness. There is no rebound and no guarding.  Musculoskeletal: Normal range of motion. He exhibits no edema or tenderness.  Lymphadenopathy:    He has no cervical adenopathy.  Neurological: He is alert and oriented to person, place, and time. He has normal reflexes. No cranial nerve deficit. He exhibits normal muscle tone. He displays a negative Romberg sign. Coordination and gait normal.  Skin: Skin is warm and dry. No rash noted. There is erythema.  Psychiatric: He has a normal mood and affect. His behavior is normal. Judgment and thought content normal.  diffuse erythema - upper back Testes - self exam  No results found for: WBC, HGB, HCT, PLT, GLUCOSE, CHOL, TRIG, HDL, LDLDIRECT, LDLCALC, ALT, AST, NA, K, CL, CREATININE, BUN, CO2, TSH, PSA, INR, GLUF, HGBA1C, MICROALBUR  No results found.  Assessment & Plan:   There are no diagnoses linked to this  encounter. I am having Mr. Limas start on triamcinolone ointment. I am also having him maintain his Vitamin D3 and amphetamine-dextroamphetamine.  Meds ordered this encounter  Medications  . triamcinolone ointment (KENALOG) 0.1 %    Sig: Apply 1 application topically 3 (three) times daily.    Dispense:  80 g    Refill:  1     Follow-up:  No Follow-up on file.  Sonda Primes, MD

## 2016-06-29 NOTE — Assessment & Plan Note (Signed)
Triam oin prn Sun protection

## 2016-07-08 ENCOUNTER — Telehealth: Payer: Self-pay | Admitting: Internal Medicine

## 2016-07-08 NOTE — Telephone Encounter (Signed)
OK. Thx

## 2016-07-08 NOTE — Telephone Encounter (Signed)
Patient states he is going out of town tomorrow.  Dr. Posey ReaPlotnikov has already written him a script for Adderall dated for 5/11 (which is early b/c of this).  Patient would like to get script today in order to make his flight tomorrow morning.  Does patient need to pick up a new script today or can his pharmacy be called to given an OK to dispense?  [address] [facility] [address]  Please follow up with patient in regard.

## 2016-07-08 NOTE — Telephone Encounter (Signed)
please advise, patient will be out of town till the 27th of this month, he last got it filled 04/16.

## 2016-07-09 NOTE — Telephone Encounter (Signed)
LM notifying pt okay to fill early, if still in town to have pharmacy call us and we will give okay to refill.

## 2016-07-15 ENCOUNTER — Ambulatory Visit (INDEPENDENT_AMBULATORY_CARE_PROVIDER_SITE_OTHER): Payer: BLUE CROSS/BLUE SHIELD | Admitting: Nurse Practitioner

## 2016-07-15 ENCOUNTER — Other Ambulatory Visit (INDEPENDENT_AMBULATORY_CARE_PROVIDER_SITE_OTHER): Payer: BLUE CROSS/BLUE SHIELD

## 2016-07-15 ENCOUNTER — Encounter: Payer: Self-pay | Admitting: Nurse Practitioner

## 2016-07-15 VITALS — BP 118/80 | HR 59 | Temp 98.0°F | Ht 69.0 in | Wt 171.0 lb

## 2016-07-15 DIAGNOSIS — Z0000 Encounter for general adult medical examination without abnormal findings: Secondary | ICD-10-CM

## 2016-07-15 DIAGNOSIS — F419 Anxiety disorder, unspecified: Secondary | ICD-10-CM | POA: Diagnosis not present

## 2016-07-15 LAB — CBC WITH DIFFERENTIAL/PLATELET
BASOS PCT: 0.6 % (ref 0.0–3.0)
Basophils Absolute: 0.1 10*3/uL (ref 0.0–0.1)
EOS PCT: 0.8 % (ref 0.0–5.0)
Eosinophils Absolute: 0.1 10*3/uL (ref 0.0–0.7)
HEMATOCRIT: 45.7 % (ref 39.0–52.0)
Hemoglobin: 15.9 g/dL (ref 13.0–17.0)
LYMPHS ABS: 1.9 10*3/uL (ref 0.7–4.0)
Lymphocytes Relative: 19.4 % (ref 12.0–46.0)
MCHC: 34.7 g/dL (ref 30.0–36.0)
MCV: 89.5 fl (ref 78.0–100.0)
Monocytes Absolute: 0.4 10*3/uL (ref 0.1–1.0)
Monocytes Relative: 4.4 % (ref 3.0–12.0)
NEUTROS ABS: 7.5 10*3/uL (ref 1.4–7.7)
NEUTROS PCT: 74.8 % (ref 43.0–77.0)
PLATELETS: 279 10*3/uL (ref 150.0–400.0)
RBC: 5.11 Mil/uL (ref 4.22–5.81)
RDW: 11.9 % (ref 11.5–15.5)
WBC: 10 10*3/uL (ref 4.0–10.5)

## 2016-07-15 LAB — LIPID PANEL
Cholesterol: 152 mg/dL (ref 0–200)
HDL: 65.1 mg/dL (ref >39.00)
LDL CALC: 80 mg/dL (ref 0–99)
NONHDL: 87.18
Total CHOL/HDL Ratio: 2
Triglycerides: 35 mg/dL (ref 0.0–149.0)
VLDL: 7 mg/dL (ref 0.0–40.0)

## 2016-07-15 LAB — HEPATIC FUNCTION PANEL
ALBUMIN: 4.9 g/dL (ref 3.5–5.2)
ALT: 19 U/L (ref 0–53)
AST: 16 U/L (ref 0–37)
Alkaline Phosphatase: 76 U/L (ref 39–117)
BILIRUBIN DIRECT: 0.2 mg/dL (ref 0.0–0.3)
Total Bilirubin: 0.8 mg/dL (ref 0.2–1.2)
Total Protein: 8 g/dL (ref 6.0–8.3)

## 2016-07-15 LAB — BASIC METABOLIC PANEL
BUN: 22 mg/dL (ref 6–23)
CALCIUM: 9.7 mg/dL (ref 8.4–10.5)
CO2: 28 mEq/L (ref 19–32)
CREATININE: 0.98 mg/dL (ref 0.40–1.50)
Chloride: 101 mEq/L (ref 96–112)
GFR: 98.11 mL/min (ref >60.00)
Glucose, Bld: 96 mg/dL (ref 70–99)
Potassium: 4 mEq/L (ref 3.5–5.1)
Sodium: 138 mEq/L (ref 135–145)

## 2016-07-15 LAB — TSH: TSH: 0.74 u[IU]/mL (ref 0.35–4.50)

## 2016-07-15 MED ORDER — VENLAFAXINE HCL ER 37.5 MG PO CP24: 37.5000 mg | ORAL_CAPSULE | Freq: Every day | ORAL | 0 refills | Status: DC

## 2016-07-15 NOTE — Progress Notes (Signed)
Subjective:  Patient ID: Gary Jennings, male    DOB: 03/08/1990  Age: 26 y.o. MRN: [id number]  CC: Anxiety (anxiety "worry" and mood swing--going on for a little while now. )   Anxiety  Presents for initial visit. Onset was at an unknown time. The problem has been waxing and waning. Symptoms include decreased concentration, excessive worry, muscle tension, nervous/anxious behavior, panic and restlessness. Patient reports no chest pain, compulsions, confusion, depressed mood, dizziness, dry mouth, feeling of choking, hyperventilation, impotence, insomnia, irritability, malaise, nausea, obsessions, palpitations, shortness of breath or suicidal ideas. Symptoms occur most days. The severity of symptoms is interfering with daily activities. Exacerbated by: unknown. The quality of sleep is good.   Risk factors include family history. His past medical history is significant for anxiety/panic attacks. There is no history of suicide attempts. Past treatments include SSRIs. Compliance with prior treatments has been poor. Prior compliance problems include medication issues.   zoloft taken in past as teenager. Affecting personal relationships and job. No drug use Social ETOH use. Does not use adderrall daily, takes medication 2-5times a week. He does not notice a differents in mood with use of Adderall.  Outpatient Medications Prior to Visit  Medication Sig Dispense Refill  . amphetamine-dextroamphetamine (ADDERALL XR) 15 MG 24 hr capsule Take 1 capsule by mouth every morning. (Patient not taking: Reported on 07/15/2016) 30 capsule 0  . Cholecalciferol (VITAMIN D3) 2000 units capsule Take 1 capsule (2,000 Units total) by mouth daily. (Patient not taking: Reported on 07/15/2016) 100 capsule 3  . triamcinolone ointment (KENALOG) 0.1 % Apply 1 application topically 3 (three) times daily. (Patient not taking: Reported on 07/15/2016) 80 g 1   No facility-administered medications prior to visit.     ROS See  HPI  Objective:  BP 118/80   Pulse (!) 59   Temp 98 F (36.7 C)   Ht 5\' 9"  (1.753 m)   Wt 171 lb (77.6 kg)   SpO2 98%   BMI 25.25 kg/m   BP Readings from Last 3 Encounters:  07/15/16 118/80  06/29/16 118/78  06/11/16 122/78    Wt Readings from Last 3 Encounters:  07/15/16 171 lb (77.6 kg)  06/29/16 175 lb (79.4 kg)  06/11/16 176 lb (79.8 kg)    Physical Exam  Constitutional: He is oriented to person, place, and time. No distress.  Cardiovascular: Normal rate, regular rhythm and normal heart sounds.   Pulmonary/Chest: Effort normal and breath sounds normal.  Musculoskeletal: Normal range of motion. He exhibits no edema.  Neurological: He is alert and oriented to person, place, and time.  Skin: Skin is warm and dry.  Psychiatric: Thought content normal.  Appears restless  Vitals reviewed.   Lab Results  Component Value Date   WBC 10.0 07/15/2016   HGB 15.9 07/15/2016   HCT 45.7 07/15/2016   PLT 279.0 07/15/2016   GLUCOSE 96 07/15/2016   CHOL 152 07/15/2016   TRIG 35.0 07/15/2016   HDL 65.10 07/15/2016   LDLCALC 80 07/15/2016   ALT 19 07/15/2016   AST 16 07/15/2016   NA 138 07/15/2016   K 4.0 07/15/2016   CL 101 07/15/2016   CREATININE 0.98 07/15/2016   BUN 22 07/15/2016   CO2 28 07/15/2016   TSH 0.74 07/15/2016    No results found.  Assessment & Plan:   Domingo DimesDylan was seen today for anxiety.  Diagnoses and all orders for this visit:  Anxiety -     venlafaxine XR (EFFEXOR-XR) 37.5 MG  24 hr capsule; Take 1 capsule (37.5 mg total) by mouth daily with breakfast.   I am having Mr. Macrae start on venlafaxine XR. I am also having him maintain his Vitamin D3, triamcinolone ointment, amphetamine-dextroamphetamine, and ADDERALL XR.  Meds ordered this encounter  Medications  . ADDERALL XR 20 MG 24 hr capsule    Sig: Take 20 mg by mouth 2 (two) times daily as needed.    Refill:  0  . venlafaxine XR (EFFEXOR-XR) 37.5 MG 24 hr capsule    Sig: Take 1 capsule  (37.5 mg total) by mouth daily with breakfast.    Dispense:  30 capsule    Refill:  0    Order Specific Question:   Supervising Provider    Answer:   Tresa Garter [[id number]]    Follow-up: Return in about 2 weeks (around 07/29/2016) for anxiety with Dr. Posey Rea.  Alysia Penna, NP

## 2016-07-15 NOTE — Patient Instructions (Addendum)
Go to lab for blood draw.  Maintain upcoming appt with psychology.  Mobile crisis: [phone]  Venlafaxine extended-release capsules What is this medicine? VENLAFAXINE(VEN la fax een) is used to treat depression, anxiety and panic disorder. This medicine may be used for other purposes; ask your health care provider or pharmacist if you have questions. COMMON BRAND NAME(S): Effexor XR What should I tell my health care provider before I take this medicine? They need to know if you have any of these conditions: -bleeding disorders -glaucoma -heart disease -high blood pressure -high cholesterol -kidney disease -liver disease -low levels of sodium in the blood -mania or bipolar disorder -seizures -suicidal thoughts, plans, or attempt; a previous suicide attempt by you or a family -take medicines that treat or prevent blood clots -thyroid disease -an unusual or allergic reaction to venlafaxine, desvenlafaxine, other medicines, foods, dyes, or preservatives -pregnant or trying to get pregnant -breast-feeding How should I use this medicine? Take this medicine by mouth with a full glass of water. Follow the directions on the prescription label. Do not cut, crush, or chew this medicine. Take it with food. If needed, the capsule may be carefully opened and the entire contents sprinkled on a spoonful of cool applesauce. Swallow the applesauce/pellet mixture right away without chewing and follow with a glass of water to ensure complete swallowing of the pellets. Try to take your medicine at about the same time each day. Do not take your medicine more often than directed. Do not stop taking this medicine suddenly except upon the advice of your doctor. Stopping this medicine too quickly may cause serious side effects or your condition may worsen. A special MedGuide will be given to you by the pharmacist with each prescription and refill. Be sure to read this information carefully each time. Talk  to your pediatrician regarding the use of this medicine in children. Special care may be needed. Overdosage: If you think you have taken too much of this medicine contact a poison control center or emergency room at once. NOTE: This medicine is only for you. Do not share this medicine with others. What if I miss a dose? If you miss a dose, take it as soon as you can. If it is almost time for your next dose, take only that dose. Do not take double or extra doses. What may interact with this medicine? Do not take this medicine with any of the following medications: -certain medicines for fungal infections like fluconazole, itraconazole, ketoconazole, posaconazole, voriconazole -cisapride -desvenlafaxine -dofetilide -dronedarone -duloxetine -levomilnacipran -linezolid -MAOIs like Carbex, Eldepryl, Marplan, Nardil, and Parnate -methylene blue (injected into a vein) -milnacipran -pimozide -thioridazine -ziprasidone This medicine may also interact with the following medications: -amphetamines -aspirin and aspirin-like medicines -certain medicines for depression, anxiety, or psychotic disturbances -certain medicines for migraine headaches like almotriptan, eletriptan, frovatriptan, naratriptan, rizatriptan, sumatriptan, zolmitriptan -certain medicines for sleep -certain medicines that treat or prevent blood clots like dalteparin, enoxaparin, warfarin -cimetidine -clozapine -diuretics -fentanyl -furazolidone -indinavir -isoniazid -lithium -metoprolol -NSAIDS, medicines for pain and inflammation, like ibuprofen or naproxen -other medicines that prolong the QT interval (cause an abnormal heart rhythm) -procarbazine -rasagiline -supplements like St. John's wort, kava kava, valerian -tramadol -tryptophan This list may not describe all possible interactions. Give your health care provider a list of all the medicines, herbs, non-prescription drugs, or dietary supplements you use. Also  tell them if you smoke, drink alcohol, or use illegal drugs. Some items may interact with your medicine. What should I watch for while  using this medicine? Tell your doctor if your symptoms do not get better or if they get worse. Visit your doctor or health care professional for regular checks on your progress. Because it may take several weeks to see the full effects of this medicine, it is important to continue your treatment as prescribed by your doctor. Patients and their families should watch out for new or worsening thoughts of suicide or depression. Also watch out for sudden changes in feelings such as feeling anxious, agitated, panicky, irritable, hostile, aggressive, impulsive, severely restless, overly excited and hyperactive, or not being able to sleep. If this happens, especially at the beginning of treatment or after a change in dose, call your health care professional. This medicine can cause an increase in blood pressure. Check with your doctor for instructions on monitoring your blood pressure while taking this medicine. You may get drowsy or dizzy. Do not drive, use machinery, or do anything that needs mental alertness until you know how this medicine affects you. Do not stand or sit up quickly, especially if you are an older patient. This reduces the risk of dizzy or fainting spells. Alcohol may interfere with the effect of this medicine. Avoid alcoholic drinks. Your mouth may get dry. Chewing sugarless gum, sucking hard candy and drinking plenty of water will help. Contact your doctor if the problem does not go away or is severe. What side effects may I notice from receiving this medicine? Side effects that you should report to your doctor or health care professional as soon as possible: -allergic reactions like skin rash, itching or hives, swelling of the face, lips, or tongue -anxious -breathing problems -confusion -changes in vision -chest pain -confusion -elevated mood,  decreased need for sleep, racing thoughts, impulsive behavior -eye pain -fast, irregular heartbeat -feeling faint or lightheaded, falls -feeling agitated, angry, or irritable -hallucination, loss of contact with reality -high blood pressure -loss of balance or coordination -palpitations -redness, blistering, peeling or loosening of the skin, including inside the mouth -restlessness, pacing, inability to keep still -seizures -stiff muscles -suicidal thoughts or other mood changes -trouble passing urine or change in the amount of urine -trouble sleeping -unusual bleeding or bruising -unusually weak or tired -vomiting Side effects that usually do not require medical attention (report to your doctor or health care professional if they continue or are bothersome): -change in sex drive or performance -change in appetite or weight -constipation -dizziness -dry mouth -headache -increased sweating -nausea -tired This list may not describe all possible side effects. Call your doctor for medical advice about side effects. You may report side effects to FDA at 1-800-FDA-1088. Where should I keep my medicine? Keep out of the reach of children. Store at a controlled temperature between 20 and 25 degrees C (68 degrees and 77 degrees F), in a dry place. Throw away any unused medicine after the expiration date. NOTE: This sheet is a summary. It may not cover all possible information. If you have questions about this medicine, talk to your doctor, pharmacist, or health care provider.  2018 Elsevier/Gold Standard (2015-07-17 18:38:02)

## 2016-07-19 ENCOUNTER — Ambulatory Visit (INDEPENDENT_AMBULATORY_CARE_PROVIDER_SITE_OTHER): Payer: BLUE CROSS/BLUE SHIELD | Admitting: Psychology

## 2016-07-19 DIAGNOSIS — F411 Generalized anxiety disorder: Secondary | ICD-10-CM

## 2016-07-29 ENCOUNTER — Ambulatory Visit: Payer: BLUE CROSS/BLUE SHIELD | Admitting: Psychology

## 2016-08-09 ENCOUNTER — Ambulatory Visit: Payer: BLUE CROSS/BLUE SHIELD | Admitting: Psychology

## 2016-08-23 ENCOUNTER — Ambulatory Visit: Payer: BLUE CROSS/BLUE SHIELD | Admitting: Psychology

## 2016-08-25 ENCOUNTER — Ambulatory Visit (INDEPENDENT_AMBULATORY_CARE_PROVIDER_SITE_OTHER): Payer: BLUE CROSS/BLUE SHIELD | Admitting: Psychology

## 2016-08-25 DIAGNOSIS — F411 Generalized anxiety disorder: Secondary | ICD-10-CM

## 2016-08-27 ENCOUNTER — Encounter: Payer: Self-pay | Admitting: Internal Medicine

## 2016-08-27 ENCOUNTER — Ambulatory Visit (INDEPENDENT_AMBULATORY_CARE_PROVIDER_SITE_OTHER): Payer: BLUE CROSS/BLUE SHIELD | Admitting: Internal Medicine

## 2016-08-27 DIAGNOSIS — F419 Anxiety disorder, unspecified: Secondary | ICD-10-CM | POA: Diagnosis not present

## 2016-08-27 DIAGNOSIS — F988 Other specified behavioral and emotional disorders with onset usually occurring in childhood and adolescence: Secondary | ICD-10-CM | POA: Diagnosis not present

## 2016-08-27 MED ORDER — AMPHETAMINE-DEXTROAMPHET ER 20 MG PO CP24: 20.0000 mg | ORAL_CAPSULE | Freq: Every day | ORAL | 0 refills | Status: DC

## 2016-08-27 MED ORDER — ESCITALOPRAM OXALATE 5 MG PO TABS: 5.0000 mg | ORAL_TABLET | Freq: Every day | ORAL | 5 refills | Status: DC

## 2016-08-27 NOTE — Progress Notes (Signed)
Subjective:  Patient ID: Gary Jennings, male    DOB: 05/20/1990  Age: 26 y.o. MRN: [id number]  CC: No chief complaint on file.   HPI Gary ClientDylan S Marker presents for ADD. C/o anxiety. He pt never took Effexor.  Outpatient Medications Prior to Visit  Medication Sig Dispense Refill  . Cholecalciferol (VITAMIN D3) 2000 units capsule Take 1 capsule (2,000 Units total) by mouth daily. 100 capsule 3  . triamcinolone ointment (KENALOG) 0.1 % Apply 1 application topically 3 (three) times daily. 80 g 1  . ADDERALL XR 20 MG 24 hr capsule Take 20 mg by mouth 2 (two) times daily as needed.  0  . amphetamine-dextroamphetamine (ADDERALL XR) 15 MG 24 hr capsule Take 1 capsule by mouth every morning. (Patient not taking: Reported on 07/15/2016) 30 capsule 0  . venlafaxine XR (EFFEXOR-XR) 37.5 MG 24 hr capsule Take 1 capsule (37.5 mg total) by mouth daily with breakfast. 30 capsule 0   No facility-administered medications prior to visit.     ROS Review of Systems  Constitutional: Negative for appetite change, fatigue and unexpected weight change.  HENT: Negative for congestion, nosebleeds, sneezing, sore throat and trouble swallowing.   Eyes: Negative for itching and visual disturbance.  Respiratory: Negative for cough.   Cardiovascular: Negative for chest pain, palpitations and leg swelling.  Gastrointestinal: Negative for abdominal distention, blood in stool, diarrhea and nausea.  Genitourinary: Negative for frequency and hematuria.  Musculoskeletal: Negative for back pain, gait problem, joint swelling and neck pain.  Skin: Negative for rash.  Neurological: Negative for dizziness, tremors, speech difficulty and weakness.  Psychiatric/Behavioral: Positive for decreased concentration. Negative for agitation, dysphoric mood, sleep disturbance and suicidal ideas. The patient is nervous/anxious.     Objective:  BP 116/78 (BP Location: Left Arm, Patient Position: Sitting, Cuff Size: Normal)   Pulse (!)  56   Temp 97.8 F (36.6 C) (Oral)   Ht 5\' 9"  (1.753 m)   Wt 172 lb (78 kg)   SpO2 99%   BMI 25.40 kg/m   BP Readings from Last 3 Encounters:  08/27/16 116/78  07/15/16 118/80  06/29/16 118/78    Wt Readings from Last 3 Encounters:  08/27/16 172 lb (78 kg)  07/15/16 171 lb (77.6 kg)  06/29/16 175 lb (79.4 kg)    Physical Exam  Constitutional: He is oriented to person, place, and time. He appears well-developed. No distress.  NAD  HENT:  Mouth/Throat: Oropharynx is clear and moist.  Eyes: Conjunctivae are normal. Pupils are equal, round, and reactive to light.  Neck: Normal range of motion. No JVD present. No thyromegaly present.  Cardiovascular: Normal rate, regular rhythm, normal heart sounds and intact distal pulses.  Exam reveals no gallop and no friction rub.   No murmur heard. Pulmonary/Chest: Effort normal and breath sounds normal. No respiratory distress. He has no wheezes. He has no rales. He exhibits no tenderness.  Abdominal: Soft. Bowel sounds are normal. He exhibits no distension and no mass. There is no tenderness. There is no rebound and no guarding.  Musculoskeletal: Normal range of motion. He exhibits no edema or tenderness.  Lymphadenopathy:    He has no cervical adenopathy.  Neurological: He is alert and oriented to person, place, and time. He has normal reflexes. No cranial nerve deficit. He exhibits normal muscle tone. He displays a negative Romberg sign. Coordination and gait normal.  Skin: Skin is warm and dry. No rash noted.  Psychiatric: He has a normal mood and affect.  His behavior is normal. Judgment and thought content normal.    Lab Results  Component Value Date   WBC 10.0 07/15/2016   HGB 15.9 07/15/2016   HCT 45.7 07/15/2016   PLT 279.0 07/15/2016   GLUCOSE 96 07/15/2016   CHOL 152 07/15/2016   TRIG 35.0 07/15/2016   HDL 65.10 07/15/2016   LDLCALC 80 07/15/2016   ALT 19 07/15/2016   AST 16 07/15/2016   NA 138 07/15/2016   K 4.0  07/15/2016   CL 101 07/15/2016   CREATININE 0.98 07/15/2016   BUN 22 07/15/2016   CO2 28 07/15/2016   TSH 0.74 07/15/2016    No results found.  Assessment & Plan:   Diagnoses and all orders for this visit:  Attention deficit disorder (ADD) without hyperactivity  Anxiety  Other orders -     Discontinue: amphetamine-dextroamphetamine (ADDERALL XR) 20 MG 24 hr capsule; Take 1 capsule (20 mg total) by mouth daily. -     amphetamine-dextroamphetamine (ADDERALL XR) 20 MG 24 hr capsule; Take 1 capsule (20 mg total) by mouth daily. -     escitalopram (LEXAPRO) 5 MG tablet; Take 1 tablet (5 mg total) by mouth daily.   I have discontinued Mr. Stites ADDERALL XR and venlafaxine XR. I am also having him start on escitalopram. Additionally, I am having him maintain his Vitamin D3, triamcinolone ointment, and amphetamine-dextroamphetamine.  Meds ordered this encounter  Medications  . DISCONTD: amphetamine-dextroamphetamine (ADDERALL XR) 20 MG 24 hr capsule    Sig: Take 20 mg by mouth daily.  Marland Kitchen DISCONTD: amphetamine-dextroamphetamine (ADDERALL XR) 20 MG 24 hr capsule    Sig: Take 1 capsule (20 mg total) by mouth daily.    Dispense:  30 capsule    Refill:  0    Please fill on or after 10/09/16  . amphetamine-dextroamphetamine (ADDERALL XR) 20 MG 24 hr capsule    Sig: Take 1 capsule (20 mg total) by mouth daily.    Dispense:  30 capsule    Refill:  0    Please fill on or after 11/09/16  . escitalopram (LEXAPRO) 5 MG tablet    Sig: Take 1 tablet (5 mg total) by mouth daily.    Dispense:  30 tablet    Refill:  5     Follow-up: No Follow-up on file.  Sonda Primes, MD

## 2016-08-27 NOTE — Assessment & Plan Note (Signed)
Adderall

## 2016-08-27 NOTE — Assessment & Plan Note (Signed)
Start Lexapro Cont seeing a therapist

## 2016-08-31 ENCOUNTER — Telehealth: Payer: Self-pay | Admitting: Internal Medicine

## 2016-08-31 NOTE — Telephone Encounter (Signed)
Pt called in and said this med is making him feel werid and

## 2016-08-31 NOTE — Telephone Encounter (Signed)
Pt states escitalopram (LEXAPRO) 5 MG tablet Is giving him anxiety, he would like a call back

## 2016-08-31 NOTE — Telephone Encounter (Signed)
LMTCB

## 2016-09-02 NOTE — Telephone Encounter (Signed)
Patient Name: Gary Jennings DOB: 07/20/1990 Initial Comment Caller states he was recently prescribed lexapro and states it is making his anziety worse. Nurse Assessment Nurse: Gary LeberWomble, RN, Gary Jennings Date/Time (Eastern Time): 09/02/2016 10:57:45 AM Confirm and document reason for call. If symptomatic, describe symptoms. ---The caller states that he recently started on Lexipro and his symptoms are getting worse. He stated that he quit taking the Lexipro because he had the worse panic attack ever. He states that his anxiety is worse and that he is giving himself panic attacks. Does the patient have any new or worsening symptoms? ---Yes Will a triage be completed? ---Yes Related visit to physician within the last 2 weeks? ---Yes Does the PT have any chronic conditions? (i.e. diabetes, asthma, etc.) ---Yes List chronic conditions. ---panic and anxiety attacks Is the patient pregnant or possibly pregnant? (Ask all females between the ages of 7512-55) ---No Is this a behavioral health or substance abuse call? ---No Guidelines Guideline Title Affirmed Question Affirmed Notes Anxiety and Panic Attack Symptoms interfere with work or school Final Disposition User See Physician within 24 Hours Womble, RN, Information systems managerDeborah Comments An appointment was obtained with Dr. Jonny Jennings for tomorrow at 11:15 and patient was instructed and said that would be good for him. Referrals REFERRED TO PCP OFFICE Disagree/Comply: Comply

## 2016-09-03 ENCOUNTER — Ambulatory Visit: Payer: Self-pay | Admitting: Internal Medicine

## 2016-09-03 ENCOUNTER — Ambulatory Visit (INDEPENDENT_AMBULATORY_CARE_PROVIDER_SITE_OTHER): Payer: BLUE CROSS/BLUE SHIELD | Admitting: Psychology

## 2016-09-03 DIAGNOSIS — F411 Generalized anxiety disorder: Secondary | ICD-10-CM

## 2016-09-06 ENCOUNTER — Ambulatory Visit (INDEPENDENT_AMBULATORY_CARE_PROVIDER_SITE_OTHER): Payer: BLUE CROSS/BLUE SHIELD | Admitting: Family

## 2016-09-06 ENCOUNTER — Encounter: Payer: Self-pay | Admitting: Family

## 2016-09-06 VITALS — BP 126/84 | HR 56 | Temp 97.7°F | Resp 16 | Ht 69.0 in | Wt 173.8 lb

## 2016-09-06 DIAGNOSIS — F411 Generalized anxiety disorder: Secondary | ICD-10-CM | POA: Diagnosis not present

## 2016-09-06 MED ORDER — SERTRALINE HCL 50 MG PO TABS: 50.0000 mg | ORAL_TABLET | Freq: Every day | ORAL | 2 refills | Status: DC

## 2016-09-06 MED ORDER — CLONAZEPAM 0.5 MG PO TABS: 0.5000 mg | ORAL_TABLET | Freq: Two times a day (BID) | ORAL | 0 refills | Status: DC | PRN

## 2016-09-06 NOTE — Patient Instructions (Signed)
Thank you for choosing [facility].  SUMMARY AND INSTRUCTIONS:  Please start taking Zoloft (sertraline) daily.  Clonazepam as needed for panic attacks.   Continue to work with Photographeryour counselor.   Follow up in 1 month with Dr. Posey ReaPlotnikov or myself to check your status.         Medication:  Your prescription(s) have been submitted to your pharmacy or been printed and provided for you. Please take as directed and contact our office if you believe you are having problem(s) with the medication(s) or have any questions.  Follow up:  If your symptoms worsen or fail to improve, please contact our office for further instruction, or in case of emergency go directly to the emergency room at the closest medical facility.

## 2016-09-06 NOTE — Progress Notes (Signed)
Subjective:    Patient ID: Gary Jennings, male    DOB: 01-05-1991, 26 y.o.   MRN: [id number]  Chief Complaint  Patient presents with  . Anxiety    states he has anxiety everyday and it is random, happens in the mornings    HPI:  Gary Jennings is a 26 y.o. male who  has a past medical history of Acne and ADD (attention deficit disorder). and presents today for an acute office visit.  Associated symptom of anxiety and panic attacks with mood changes have been going on for about 3 months. Previously prescribed Adderall to help with his attention and decrease his anxiety. Not currently taking Adderall secondary to not being in school. Panic attack symptoms include heart racing with no shortness of breath. Modifying factors include counseling. Previous treatment failure with Lexapro and Zoloft. Anxiety symptoms occur on a daily basis with panic attacks 1-3 times per month.    Allergies  Allergen Reactions  . Lexapro [Escitalopram Oxalate]     Suicidal thoughts  . Ceftin [Cefuroxime Axetil]     n/v      Outpatient Medications Prior to Visit  Medication Sig Dispense Refill  . amphetamine-dextroamphetamine (ADDERALL XR) 20 MG 24 hr capsule Take 1 capsule (20 mg total) by mouth daily. 30 capsule 0  . Cholecalciferol (VITAMIN D3) 2000 units capsule Take 1 capsule (2,000 Units total) by mouth daily. 100 capsule 3  . triamcinolone ointment (KENALOG) 0.1 % Apply 1 application topically 3 (three) times daily. 80 g 1  . escitalopram (LEXAPRO) 5 MG tablet Take 1 tablet (5 mg total) by mouth daily. (Patient not taking: Reported on 09/06/2016) 30 tablet 5   No facility-administered medications prior to visit.     Review of Systems  Constitutional: Negative for chills and fever.  Respiratory: Negative for chest tightness and shortness of breath.   Cardiovascular: Positive for palpitations. Negative for chest pain and leg swelling.  Neurological: Negative for headaches.      Objective:      BP 126/84 (BP Location: Left Arm, Patient Position: Sitting, Cuff Size: Normal)   Pulse (!) 56   Temp 97.7 F (36.5 C) (Oral)   Resp 16   Ht 5\' 9"  (1.753 m)   Wt 173 lb 12.8 oz (78.8 kg)   SpO2 98%   BMI 25.67 kg/m  Nursing note and vital signs reviewed.  Physical Exam  Constitutional: He is oriented to person, place, and time. He appears well-developed and well-nourished. No distress.  Cardiovascular: Normal rate, regular rhythm, normal heart sounds and intact distal pulses.   Pulmonary/Chest: Effort normal and breath sounds normal.  Neurological: He is alert and oriented to person, place, and time.  Skin: Skin is warm and dry.  Psychiatric: His behavior is normal. Judgment and thought content normal. His mood appears anxious.       Assessment & Plan:   Problem List Items Addressed This Visit      Other   Generalized anxiety disorder - Primary    Symptoms and exam consistent with generalized anxiety disorder. Previous treatment failure with Lexapro. Was on Zoloft as a child and does not remember effectiveness. Restart Zoloft. Start clonazepam as needed for panic attacks. Continue management with counseling. Follow-up in one month or sooner if needed.          I have discontinued Mr. Baudoin escitalopram. I am also having him start on sertraline and clonazePAM. Additionally, I am having him maintain his Vitamin D3, triamcinolone ointment,  and amphetamine-dextroamphetamine.   Meds ordered this encounter  Medications  . sertraline (ZOLOFT) 50 MG tablet    Sig: Take 1 tablet (50 mg total) by mouth daily.    Dispense:  30 tablet    Refill:  2    Order Specific Question:   Supervising Provider    Answer:   Hillard DankerRAWFORD, ELIZABETH A [[id number]]  . clonazePAM (KLONOPIN) 0.5 MG tablet    Sig: Take 1 tablet (0.5 mg total) by mouth 2 (two) times daily as needed for anxiety.    Dispense:  10 tablet    Refill:  0    Order Specific Question:   Supervising Provider    Answer:    Hillard DankerRAWFORD, ELIZABETH A [[id number]]     Follow-up: Return in about 1 month (around 10/07/2016), or if symptoms worsen or fail to improve.  Jeanine Luzalone, Gregory, FNP

## 2016-09-06 NOTE — Assessment & Plan Note (Signed)
Symptoms and exam consistent with generalized anxiety disorder. Previous treatment failure with Lexapro. Was on Zoloft as a child and does not remember effectiveness. Restart Zoloft. Start clonazepam as needed for panic attacks. Continue management with counseling. Follow-up in one month or sooner if needed.

## 2016-09-27 ENCOUNTER — Ambulatory Visit: Payer: Self-pay | Admitting: Psychology

## 2016-09-30 ENCOUNTER — Ambulatory Visit (INDEPENDENT_AMBULATORY_CARE_PROVIDER_SITE_OTHER): Payer: BLUE CROSS/BLUE SHIELD | Admitting: Family

## 2016-09-30 ENCOUNTER — Ambulatory Visit: Payer: BLUE CROSS/BLUE SHIELD | Admitting: Psychology

## 2016-09-30 ENCOUNTER — Encounter: Payer: Self-pay | Admitting: Family

## 2016-09-30 VITALS — BP 118/84 | HR 76 | Temp 98.5°F | Resp 16 | Ht 69.0 in | Wt 173.1 lb

## 2016-09-30 DIAGNOSIS — F411 Generalized anxiety disorder: Secondary | ICD-10-CM

## 2016-09-30 MED ORDER — SERTRALINE HCL 50 MG PO TABS: 50.0000 mg | ORAL_TABLET | Freq: Every day | ORAL | 0 refills | Status: DC

## 2016-09-30 MED ORDER — CLONAZEPAM 0.5 MG PO TABS
0.5000 mg | ORAL_TABLET | Freq: Two times a day (BID) | ORAL | 0 refills | Status: DC | PRN
Start: 2016-09-30 — End: 2016-11-12

## 2016-09-30 NOTE — Assessment & Plan Note (Signed)
Generalized anxiety disorder improved with clonazepam taken as needed. Encouraged to start sertraline given daily symptoms. Continue current dosage of clonazepam. [place] controlled substance database reviewed with no irregularities.

## 2016-09-30 NOTE — Progress Notes (Signed)
Subjective:    Patient ID: Gary Jennings, male    DOB: 08/16/1990, 26 y.o.   MRN: [id number]  Chief Complaint  Patient presents with  . Follow-up    states that he has not been taking the zoloft but he has taken the clonzapam as needed for panic attacks whcih have helped     HPI:  Gary Jennings is a 26 y.o. male who  has a past medical history of Acne and ADD (attention deficit disorder). and presents today  a follow up office visit.  Anxiety - Previously prescribed sertraline and clonazepam secondary to increased levels of anxiety. Reports that he has taken the clonazepam but has not started taking sertraline. No adverse side effects and symptoms are adequately controlled with the current medication regimen. Having to take clonazepam about a 2-3 times per week at a reduced dosage. Overall symptoms of anxiety are improved.   Allergies  Allergen Reactions  . Lexapro [Escitalopram Oxalate]     Suicidal thoughts  . Ceftin [Cefuroxime Axetil]     n/v      Outpatient Medications Prior to Visit  Medication Sig Dispense Refill  . amphetamine-dextroamphetamine (ADDERALL XR) 20 MG 24 hr capsule Take 1 capsule (20 mg total) by mouth daily. 30 capsule 0  . Cholecalciferol (VITAMIN D3) 2000 units capsule Take 1 capsule (2,000 Units total) by mouth daily. 100 capsule 3  . triamcinolone ointment (KENALOG) 0.1 % Apply 1 application topically 3 (three) times daily. 80 g 1  . clonazePAM (KLONOPIN) 0.5 MG tablet Take 1 tablet (0.5 mg total) by mouth 2 (two) times daily as needed for anxiety. 10 tablet 0  . sertraline (ZOLOFT) 50 MG tablet Take 1 tablet (50 mg total) by mouth daily. 30 tablet 2   No facility-administered medications prior to visit.      Review of Systems  Constitutional: Negative for chills and fever.  Respiratory: Negative for chest tightness and shortness of breath.   Psychiatric/Behavioral: Negative for agitation, sleep disturbance and suicidal ideas. The patient is not  nervous/anxious.       Objective:    BP 118/84 (BP Location: Left Arm, Patient Position: Sitting, Cuff Size: Normal)   Pulse 76   Temp 98.5 F (36.9 C) (Oral)   Resp 16   Ht 5\' 9"  (1.753 m)   Wt 173 lb 1.9 oz (78.5 kg)   SpO2 97%   BMI 25.57 kg/m  Nursing note and vital signs reviewed.  Physical Exam  Constitutional: He is oriented to person, place, and time. He appears well-developed and well-nourished. No distress.  Cardiovascular: Normal rate, regular rhythm, normal heart sounds and intact distal pulses.   Pulmonary/Chest: Effort normal and breath sounds normal.  Neurological: He is alert and oriented to person, place, and time.  Skin: Skin is warm and dry.  Psychiatric: He has a normal mood and affect. His behavior is normal. Judgment and thought content normal.       Assessment & Plan:   Problem List Items Addressed This Visit      Other   Generalized anxiety disorder - Primary    Generalized anxiety disorder improved with clonazepam taken as needed. Encouraged to start sertraline given daily symptoms. Continue current dosage of clonazepam. [place] controlled substance database reviewed with no irregularities.          I am having Mr. Wyline MoodBranch maintain his Vitamin D3, triamcinolone ointment, amphetamine-dextroamphetamine, clonazePAM, and sertraline.   Meds ordered this encounter  Medications  . DISCONTD:  sertraline (ZOLOFT) 50 MG tablet    Sig: Take 1 tablet (50 mg total) by mouth daily.    Dispense:  30 tablet    Refill:  0    Order Specific Question:   Supervising Provider    Answer:   Hillard DankerRAWFORD, ELIZABETH A [[id number]]  . clonazePAM (KLONOPIN) 0.5 MG tablet    Sig: Take 1 tablet (0.5 mg total) by mouth 2 (two) times daily as needed for anxiety.    Dispense:  10 tablet    Refill:  0    Order Specific Question:   Supervising Provider    Answer:   Hillard DankerRAWFORD, ELIZABETH A [[id number]]  . sertraline (ZOLOFT) 50 MG tablet    Sig: Take 1 tablet (50 mg total) by mouth  daily.    Dispense:  30 tablet    Refill:  0    Order Specific Question:   Supervising Provider    Answer:   Hillard DankerRAWFORD, ELIZABETH A [[id number]]     Follow-up: Return in about 3 months (around 12/31/2016), or if symptoms worsen or fail to improve.  Jeanine Luzalone, Rayaan Lorah, FNP

## 2016-09-30 NOTE — Patient Instructions (Addendum)
Thank you for choosing [facility].  SUMMARY AND INSTRUCTIONS:  Please continue to take your medication as prescribed.  Start the Sertraline for your anxiety.   Medication:  Your prescription(s) have been submitted to your pharmacy or been printed and provided for you. Please take as directed and contact our office if you believe you are having problem(s) with the medication(s) or have any questions.  Follow up:  If your symptoms worsen or fail to improve, please contact our office for further instruction, or in case of emergency go directly to the emergency room at the closest medical facility.     Stress and Stress Management Stress is a normal reaction to life events. It is what you feel when life demands more than you are used to or more than you can handle. Some stress can be useful. For example, the stress reaction can help you catch the last bus of the day, study for a test, or meet a deadline at work. But stress that occurs too often or for too long can cause problems. It can affect your emotional health and interfere with relationships and normal daily activities. Too much stress can weaken your immune system and increase your risk for physical illness. If you already have a medical problem, stress can make it worse. What are the causes? All sorts of life events may cause stress. An event that causes stress for one person may not be stressful for another person. Major life events commonly cause stress. These may be positive or negative. Examples include losing your job, moving into a new home, getting married, having a baby, or losing a loved one. Less obvious life events may also cause stress, especially if they occur day after day or in combination. Examples include working long hours, driving in traffic, caring for children, being in debt, or being in a difficult relationship. What are the signs or symptoms? Stress may cause emotional symptoms including, the  following:  Anxiety. This is feeling worried, afraid, on edge, overwhelmed, or out of control.  Anger. This is feeling irritated or impatient.  Depression. This is feeling sad, down, helpless, or guilty.  Difficulty focusing, remembering, or making decisions.  Stress may cause physical symptoms, including the following:  Aches and pains. These may affect your head, neck, back, stomach, or other areas of your body.  Tight muscles or clenched jaw.  Low energy or trouble sleeping.  Stress may cause unhealthy behaviors, including the following:  Eating to feel better (overeating) or skipping meals.  Sleeping too little, too much, or both.  Working too much or putting off tasks (procrastination).  Smoking, drinking alcohol, or using drugs to feel better.  How is this diagnosed? Stress is diagnosed through an assessment by your health care provider. Your health care provider will ask questions about your symptoms and any stressful life events.Your health care provider will also ask about your medical history and may order blood tests or other tests. Certain medical conditions and medicine can cause physical symptoms similar to stress. Mental illness can cause emotional symptoms and unhealthy behaviors similar to stress. Your health care provider may refer you to a mental health professional for further evaluation. How is this treated? Stress management is the recommended treatment for stress.The goals of stress management are reducing stressful life events and coping with stress in healthy ways. Techniques for reducing stressful life events include the following:  Stress identification. Self-monitor for stress and identify what causes stress for you. These skills may help you  to avoid some stressful events.  Time management. Set your priorities, keep a calendar of events, and learn to say "no." These tools can help you avoid making too many commitments.  Techniques for coping with  stress include the following:  Rethinking the problem. Try to think realistically about stressful events rather than ignoring them or overreacting. Try to find the positives in a stressful situation rather than focusing on the negatives.  Exercise. Physical exercise can release both physical and emotional tension. The key is to find a form of exercise you enjoy and do it regularly.  Relaxation techniques. These relax the body and mind. Examples include yoga, meditation, tai chi, biofeedback, deep breathing, progressive muscle relaxation, listening to music, being out in nature, journaling, and other hobbies. Again, the key is to find one or more that you enjoy and can do regularly.  Healthy lifestyle. Eat a balanced diet, get plenty of sleep, and do not smoke. Avoid using alcohol or drugs to relax.  Strong support network. Spend time with family, friends, or other people you enjoy being around.Express your feelings and talk things over with someone you trust.  Counseling or talktherapy with a mental health professional may be helpful if you are having difficulty managing stress on your own. Medicine is typically not recommended for the treatment of stress.Talk to your health care provider if you think you need medicine for symptoms of stress. Follow these instructions at home:  Keep all follow-up visits as directed by your health care provider.  Take all medicines as directed by your health care provider. Contact a health care provider if:  Your symptoms get worse or you start having new symptoms.  You feel overwhelmed by your problems and can no longer manage them on your own. Get help right away if:  You feel like hurting yourself or someone else. This information is not intended to replace advice given to you by your health care provider. Make sure you discuss any questions you have with your health care provider. Document Released: 08/11/2000 Document Revised: 07/24/2015 Document  Reviewed: 10/10/2012 Elsevier Interactive Patient Education  2017 [employer].

## 2016-11-12 ENCOUNTER — Ambulatory Visit (INDEPENDENT_AMBULATORY_CARE_PROVIDER_SITE_OTHER): Payer: BLUE CROSS/BLUE SHIELD | Admitting: Family

## 2016-11-12 ENCOUNTER — Encounter: Payer: Self-pay | Admitting: Family

## 2016-11-12 VITALS — BP 128/84 | HR 56 | Temp 97.8°F | Resp 16 | Ht 69.0 in | Wt 181.0 lb

## 2016-11-12 DIAGNOSIS — F411 Generalized anxiety disorder: Secondary | ICD-10-CM | POA: Diagnosis not present

## 2016-11-12 DIAGNOSIS — F988 Other specified behavioral and emotional disorders with onset usually occurring in childhood and adolescence: Secondary | ICD-10-CM | POA: Diagnosis not present

## 2016-11-12 MED ORDER — AMPHETAMINE-DEXTROAMPHET ER 20 MG PO CP24: 20.0000 mg | ORAL_CAPSULE | Freq: Every day | ORAL | 0 refills | Status: DC

## 2016-11-12 MED ORDER — CLONAZEPAM 0.5 MG PO TABS: 0.5000 mg | ORAL_TABLET | Freq: Two times a day (BID) | ORAL | 0 refills | Status: DC | PRN

## 2016-11-12 NOTE — Patient Instructions (Addendum)
Thank you for choosing [facility].  SUMMARY AND INSTRUCTIONS:   Please continue to take your medications as prescribed.  Follow up with Dr. Posey Rea  Glad things are improving.   Medication:  Your prescription(s) have been submitted to your pharmacy or been printed and provided for you. Please take as directed and contact our office if you believe you are having problem(s) with the medication(s) or have any questions.  Follow up:  If your symptoms worsen or fail to improve, please contact our office for further instruction, or in case of emergency go directly to the emergency room at the closest medical facility.

## 2016-11-12 NOTE — Assessment & Plan Note (Signed)
Generalized anxiety disorder appears well maintained with clonazepam. Discontinue sertraline. Continue current dosage of clonazepam. [place] controlled substance database reviewed with no irregularities.

## 2016-11-12 NOTE — Assessment & Plan Note (Signed)
ADHD appears stable with current medication regimen and no adverse side effects. Eating and sleeping well with no cardiac symptoms. [place] controlled substance database reviewed with no irregularities. Continue current dosage of Adderall XR.

## 2016-11-12 NOTE — Progress Notes (Signed)
Subjective:    Patient ID: Gary Jennings, male    DOB: [birth year], 26 y.o.   MRN: [id number]  Chief Complaint  Patient presents with  . Medication Refill    refill of klonopin and adderall     HPI:  Gary Jennings is a 26 y.o. male who  has a past medical history of Acne and ADD (attention deficit disorder). and presents today for an a follow up office visit.   1.) Anxiety - Currently prescribed clonazepam and sertraline. Reports taking the medication as prescribed and denies adverse side effects. Has not taken the sertraline recently as he does not feel it was helping. Symptoms are generally well controlled. Has had increased levels of anxiety secondary to life stressors which are improving.    2.) ADHD - Currently maintained on Adderall and reports taking the medication as prescribed and denies adverse side effects. Concentration and symptoms are generally well controlled with the current dosage of medication. Sleeping approximately 6-8 hours per night. No significant changes to appetite or weight. No headaches, chest pain, shortness of breath, or heart palpitations.     Allergies  Allergen Reactions  . Lexapro [Escitalopram Oxalate]     Suicidal thoughts  . Ceftin [Cefuroxime Axetil]     n/v      Outpatient Medications Prior to Visit  Medication Sig Dispense Refill  . amphetamine-dextroamphetamine (ADDERALL XR) 20 MG 24 hr capsule Take 1 capsule (20 mg total) by mouth daily. 30 capsule 0  . clonazePAM (KLONOPIN) 0.5 MG tablet Take 1 tablet (0.5 mg total) by mouth 2 (two) times daily as needed for anxiety. 10 tablet 0  . Cholecalciferol (VITAMIN D3) 2000 units capsule Take 1 capsule (2,000 Units total) by mouth daily. 100 capsule 3  . sertraline (ZOLOFT) 50 MG tablet Take 1 tablet (50 mg total) by mouth daily. 30 tablet 0  . triamcinolone ointment (KENALOG) 0.1 % Apply 1 application topically 3 (three) times daily. 80 g 1   No facility-administered medications prior to visit.       Past Medical History:  Diagnosis Date  . Acne   . ADD (attention deficit disorder)       Review of Systems  Constitutional: Negative for appetite change, diaphoresis, fatigue, fever and unexpected weight change.  Respiratory: Negative for chest tightness and shortness of breath.   Cardiovascular: Negative for chest pain, palpitations and leg swelling.  Neurological: Negative for dizziness and light-headedness.  Psychiatric/Behavioral: Positive for dysphoric mood. Negative for decreased concentration, self-injury, sleep disturbance and suicidal ideas. The patient is not nervous/anxious and is not hyperactive.       Objective:    BP 128/84 (BP Location: Left Arm, Patient Position: Sitting, Cuff Size: Normal)   Pulse (!) 56   Temp 97.8 F (36.6 C) (Oral)   Resp 16   Ht  (1.753 m)   Wt 181 lb (82.1 kg)   SpO2 98%   BMI 26.73 kg/m  Nursing note and vital signs reviewed.  Physical Exam  Constitutional: He is oriented to person, place, and time. He appears well-developed and well-nourished. No distress.  Cardiovascular: Normal rate, regular rhythm, normal heart sounds and intact distal pulses.   Pulmonary/Chest: Effort normal and breath sounds normal.  Neurological: He is alert and oriented to person, place, and time.  Skin: Skin is warm and dry.  Psychiatric: He has a normal mood and affect. His behavior is normal. Judgment and thought content normal.       Assessment &  Plan:   Problem List Items Addressed This Visit      Other   Attention deficit disorder    ADHD appears stable with current medication regimen and no adverse side effects. Eating and sleeping well with no cardiac symptoms. [place] controlled substance database reviewed with no irregularities. Continue current dosage of Adderall XR.      Generalized anxiety disorder - Primary    Generalized anxiety disorder appears well maintained with clonazepam. Discontinue sertraline. Continue current  dosage of clonazepam. [place] controlled substance database reviewed with no irregularities.          I have discontinued Mr. Hinton Vitamin D3, triamcinolone ointment, and sertraline. I am also having him maintain his clonazePAM and amphetamine-dextroamphetamine.   Meds ordered this encounter  Medications  . clonazePAM (KLONOPIN) 0.5 MG tablet    Sig: Take 1 tablet (0.5 mg total) by mouth 2 (two) times daily as needed for anxiety.    Dispense:  30 tablet    Refill:  0    Order Specific Question:   Supervising Provider    Answer:   Hillard Danker A [[id number]]  . DISCONTD: amphetamine-dextroamphetamine (ADDERALL XR) 20 MG 24 hr capsule    Sig: Take 1 capsule (20 mg total) by mouth daily.    Dispense:  30 capsule    Refill:  0    Please fill on or after 12/09/16    Order Specific Question:   Supervising Provider    Answer:   Hillard Danker A [[id number]]  . DISCONTD: amphetamine-dextroamphetamine (ADDERALL XR) 20 MG 24 hr capsule    Sig: Take 1 capsule (20 mg total) by mouth daily.    Dispense:  30 capsule    Refill:  0    Please fill on or after 01/08/17    Order Specific Question:   Supervising Provider    Answer:   Hillard Danker A [[id number]]  . DISCONTD: amphetamine-dextroamphetamine (ADDERALL XR) 20 MG 24 hr capsule    Sig: Take 1 capsule (20 mg total) by mouth daily.    Dispense:  30 capsule    Refill:  0    Please fill on or after 01/08/17    Order Specific Question:   Supervising Provider    Answer:   Hillard Danker A [[id number]]  . amphetamine-dextroamphetamine (ADDERALL XR) 20 MG 24 hr capsule    Sig: Take 1 capsule (20 mg total) by mouth daily.    Dispense:  30 capsule    Refill:  0    Please fill on or after 02/07/17    Order Specific Question:   Supervising Provider    Answer:   Hillard Danker A [[id number]]     Follow-up: Return in about 4 months (around 03/14/2017), or if symptoms worsen or fail to improve.  Jeanine Luz, FNP

## 2017-01-07 ENCOUNTER — Telehealth: Payer: Self-pay | Admitting: Internal Medicine

## 2017-01-07 NOTE — Telephone Encounter (Signed)
Pt called in and said that he missed place his clonazePAM (KLONOPIN) 0.5 MG tablet [[id number]][[id number]]  Wanted to see if a refill could be called in?  It was last filled 9/14 and he was last seen a couple of months ago

## 2017-01-10 NOTE — Telephone Encounter (Signed)
Pt called regarding this, would like to know if he has to come in for an appointment  Please advise on refill

## 2017-01-11 MED ORDER — CLONAZEPAM 0.5 MG PO TABS: 0.5000 mg | ORAL_TABLET | Freq: Two times a day (BID) | ORAL | 0 refills | Status: DC | PRN

## 2017-01-11 NOTE — Telephone Encounter (Signed)
RX faxed please schedule OV f/u

## 2017-01-11 NOTE — Telephone Encounter (Signed)
Rx renewed  Sch OV Thx

## 2017-01-11 NOTE — Telephone Encounter (Signed)
Spoke to patient and he will call back to sch appointment once he looks at his work sch

## 2017-03-15 ENCOUNTER — Encounter: Payer: Self-pay | Admitting: Family Medicine

## 2017-03-15 ENCOUNTER — Ambulatory Visit (INDEPENDENT_AMBULATORY_CARE_PROVIDER_SITE_OTHER): Payer: Self-pay | Admitting: Family Medicine

## 2017-03-15 VITALS — BP 138/82 | HR 58 | Temp 97.7°F | Ht 69.0 in | Wt 179.0 lb

## 2017-03-15 DIAGNOSIS — L03818 Cellulitis of other sites: Secondary | ICD-10-CM

## 2017-03-15 DIAGNOSIS — L0390 Cellulitis, unspecified: Secondary | ICD-10-CM | POA: Insufficient documentation

## 2017-03-15 MED ORDER — DOXYCYCLINE HYCLATE 100 MG PO TABS: 100.0000 mg | ORAL_TABLET | Freq: Two times a day (BID) | ORAL | 0 refills | Status: DC

## 2017-03-15 NOTE — Progress Notes (Signed)
Gary Jennings - 27 y.o. male MRN [id number]  Date of birth: Jan 16, 1991  SUBJECTIVE:  Including CC & ROS.  Chief Complaint  Patient presents with  . Right inguinal bump    Gary Jennings is a 27 y.o. male that is  presenting with right inguinal bump. He states this has been ongoing for three months. He states there was a skin tag present prior to it enlarging in size. He states it increased in size over the weekend. Describes redness and red streak present. Swelling and tenderness present. Denies fever or chills. He has significant pain when walking. The pain is localized to this area. He denies any testicular pain or pain with urination. He hasn't had any penetrating trauma. He hasn't had any new sexual contacts. He denies any falls this weekend that could have contributed. He's had normal bowel movements.   Review of Systems  Constitutional: Negative for fever.  Respiratory: Negative for cough.   Cardiovascular: Negative for chest pain.  Gastrointestinal: Negative for abdominal pain.  Musculoskeletal: Negative for gait problem.  Skin: Positive for color change.  Neurological: Negative for weakness.  Hematological: Positive for adenopathy.  Psychiatric/Behavioral: Negative for agitation.    HISTORY: Past Medical, Surgical, Social, and Family History Reviewed & Updated per EMR.   Pertinent Historical Findings include:  Past Medical History:  Diagnosis Date  . Acne   . ADD (attention deficit disorder)     No past surgical history on file.  Allergies  Allergen Reactions  . Lexapro [Escitalopram Oxalate]     Suicidal thoughts  . Ceftin [Cefuroxime Axetil]     n/v    Family History  Problem Relation Age of Onset  . COPD Father      Social History   Socioeconomic History  . Marital status: Single    Spouse name: Not on file  . Number of children: Not on file  . Years of education: Not on file  . Highest education level: Not on file  Social Needs  . Financial resource  strain: Not on file  . Food insecurity - worry: Not on file  . Food insecurity - inability: Not on file  . Transportation needs - medical: Not on file  . Transportation needs - non-medical: Not on file  Occupational History  . Not on file  Tobacco Use  . Smoking status: Never Smoker  . Smokeless tobacco: Current User  Substance and Sexual Activity  . Alcohol use: No  . Drug use: No  . Sexual activity: Not on file  Other Topics Concern  . Not on file  Social History Narrative   Parents are devoiced; lives with mother   Single   Regular exercise-[place]   Occupation: Therapist, music and [employer]     PHYSICAL EXAM:  VS: BP 138/82 (BP Location: Left Arm, Patient Position: Sitting, Cuff Size: Normal)   Pulse (!) 58   Temp 97.7 F (36.5 C) (Oral)   Ht 5\' 9"  (1.753 m)   Wt 179 lb (81.2 kg)   SpO2 98%   BMI 26.43 kg/m  Physical Exam Gen: NAD, alert, cooperative with exam, well-appearing ENT: normal lips, normal nasal mucosa,  Eye: normal EOM, normal conjunctiva and lids CV:  no edema, +2 pedal pulses   Resp: no accessory muscle use, non-labored,  Skin: skin tag on medial thigh of right upper thigh, indurated lymph nodes of right inguinal region. Overlying redness with streaking, Neuro: normal tone, normal sensation to touch Psych:  normal insight, alert and  oriented MSK: normal gait, normal strength      ASSESSMENT & PLAN:   Cellulitis Likely cellulitis with lymphangitis. Denies any new sexual contacts.  - doxycycline  - counseled on when to return.

## 2017-03-15 NOTE — Assessment & Plan Note (Signed)
Likely cellulitis with lymphangitis. Denies any new sexual contacts.  - doxycycline  - counseled on when to return.

## 2017-03-15 NOTE — Patient Instructions (Signed)
Please let us know if you are not improving with the medication or if the symptoms are worse.

## 2017-04-29 ENCOUNTER — Ambulatory Visit: Payer: Self-pay | Admitting: Family

## 2017-06-13 ENCOUNTER — Telehealth: Payer: Self-pay | Admitting: Internal Medicine

## 2017-06-13 NOTE — Telephone Encounter (Signed)
Appointment has been made for 05/1717 @315pm .

## 2017-06-13 NOTE — Telephone Encounter (Signed)
Pt  Requesting refills  Of  Klonopin  -  Not on active med list  Adderall xr 20 mg / daily  Last  RX 11/12/2016      LOV  03/19/2017 Cellulitis  03/19/2017  By Clare GandyJeremy  [facility]  [facility]  [facility] [address] / [address]

## 2017-06-13 NOTE — Telephone Encounter (Signed)
Copied from CRM [id number]. Topic: Quick Communication - Rx Refill/Question >> Jun 13, 2017  1:23 PM Jolayne Hainesaylor, Brittany L wrote: Medication: amphetamine-dextroamphetamine (ADDERALL XR) 20 MG 24 hr capsule & clonazePAM (KLONOPIN) 0.5 MG tablet [[id number]][[id number]]  DISCONTINUED  Has the patient contacted their pharmacy? Yes (Agent: If no, request that the patient contact the pharmacy for the refill.) Preferred Pharmacy (with phone number or street name): [facility] [id number] - [place], [place] - [address] [facility] [address] [place] [phone]   Agent: Please be advised that RX refills may take up to 3 business days. We ask that you follow-up with your pharmacy.

## 2017-06-13 NOTE — Telephone Encounter (Signed)
Pt needs OV to get medications, please call to schedule, thank you!

## 2017-06-14 NOTE — Telephone Encounter (Signed)
noted 

## 2017-06-15 ENCOUNTER — Ambulatory Visit: Payer: BLUE CROSS/BLUE SHIELD | Admitting: Internal Medicine

## 2017-06-15 ENCOUNTER — Encounter: Payer: Self-pay | Admitting: Internal Medicine

## 2017-06-15 DIAGNOSIS — F411 Generalized anxiety disorder: Secondary | ICD-10-CM | POA: Diagnosis not present

## 2017-06-15 DIAGNOSIS — F419 Anxiety disorder, unspecified: Secondary | ICD-10-CM | POA: Diagnosis not present

## 2017-06-15 MED ORDER — AMPHETAMINE-DEXTROAMPHET ER 20 MG PO CP24: 20.0000 mg | ORAL_CAPSULE | Freq: Every day | ORAL | 0 refills | Status: DC

## 2017-06-15 MED ORDER — CLONAZEPAM 0.5 MG PO TABS: 0.5000 mg | ORAL_TABLET | Freq: Two times a day (BID) | ORAL | 3 refills | Status: DC | PRN

## 2017-06-15 NOTE — Progress Notes (Signed)
Subjective:  Patient ID: Gary Jennings, male    DOB: 12-Aug-1990  Age: 27 y.o. MRN: [id number]  CC: No chief complaint on file.   HPI Gary Jennings presents for ADD and panic attacks f/u  Outpatient Medications Prior to Visit  Medication Sig Dispense Refill  . amphetamine-dextroamphetamine (ADDERALL XR) 20 MG 24 hr capsule Take 1 capsule (20 mg total) by mouth daily. 30 capsule 0  . doxycycline (VIBRA-TABS) 100 MG tablet Take 1 tablet (100 mg total) by mouth 2 (two) times daily. 14 tablet 0   No facility-administered medications prior to visit.     ROS Review of Systems  Constitutional: Negative for appetite change, fatigue and unexpected weight change.  HENT: Negative for congestion, nosebleeds, sneezing, sore throat and trouble swallowing.   Eyes: Negative for itching and visual disturbance.  Respiratory: Negative for cough.   Cardiovascular: Negative for chest pain, palpitations and leg swelling.  Gastrointestinal: Negative for abdominal distention, blood in stool, diarrhea and nausea.  Genitourinary: Negative for frequency and hematuria.  Musculoskeletal: Negative for back pain, gait problem, joint swelling and neck pain.  Skin: Negative for rash.  Neurological: Negative for dizziness, tremors, speech difficulty and weakness.  Psychiatric/Behavioral: Positive for decreased concentration. Negative for agitation, dysphoric mood and sleep disturbance. The patient is nervous/anxious.     Objective:  BP 126/76 (BP Location: Left Arm, Patient Position: Sitting, Cuff Size: Normal)   Pulse 61   Temp 98.4 F (36.9 C) (Oral)   Ht 5\' 9"  (1.753 m)   Wt 176 lb (79.8 kg)   SpO2 100%   BMI 25.99 kg/m   BP Readings from Last 3 Encounters:  06/15/17 126/76  03/15/17 138/82  11/12/16 128/84    Wt Readings from Last 3 Encounters:  06/15/17 176 lb (79.8 kg)  03/15/17 179 lb (81.2 kg)  11/12/16 181 lb (82.1 kg)    Physical Exam  Constitutional: He is oriented to person,  place, and time. He appears well-developed. No distress.  NAD  HENT:  Mouth/Throat: Oropharynx is clear and moist.  Eyes: Pupils are equal, round, and reactive to light. Conjunctivae are normal.  Neck: Normal range of motion. No JVD present. No thyromegaly present.  Cardiovascular: Normal rate, regular rhythm, normal heart sounds and intact distal pulses. Exam reveals no gallop and no friction rub.  No murmur heard. Pulmonary/Chest: Effort normal and breath sounds normal. No respiratory distress. He has no wheezes. He has no rales. He exhibits no tenderness.  Abdominal: Soft. Bowel sounds are normal. He exhibits no distension and no mass. There is no tenderness. There is no rebound and no guarding.  Musculoskeletal: Normal range of motion. He exhibits no edema or tenderness.  Lymphadenopathy:    He has no cervical adenopathy.  Neurological: He is alert and oriented to person, place, and time. He has normal reflexes. No cranial nerve deficit. He exhibits normal muscle tone. He displays a negative Romberg sign. Coordination and gait normal.  Skin: Skin is warm and dry. No rash noted.  Psychiatric: He has a normal mood and affect. His behavior is normal. Judgment and thought content normal.    Lab Results  Component Value Date   WBC 10.0 07/15/2016   HGB 15.9 07/15/2016   HCT 45.7 07/15/2016   PLT 279.0 07/15/2016   GLUCOSE 96 07/15/2016   CHOL 152 07/15/2016   TRIG 35.0 07/15/2016   HDL 65.10 07/15/2016   LDLCALC 80 07/15/2016   ALT 19 07/15/2016   AST 16 07/15/2016  NA 138 07/15/2016   K 4.0 07/15/2016   CL 101 07/15/2016   CREATININE 0.98 07/15/2016   BUN 22 07/15/2016   CO2 28 07/15/2016   TSH 0.74 07/15/2016    No results found.  Assessment & Plan:   There are no diagnoses linked to this encounter. I have discontinued Anand S. Fedie's doxycycline. I am also having him maintain his amphetamine-dextroamphetamine.  No orders of the defined types were placed in this  encounter.    Follow-up: No follow-ups on file.  Sonda PrimesAlex Alexsa Flaum, MD

## 2017-06-15 NOTE — Assessment & Plan Note (Signed)
Off Lexapro 

## 2017-06-15 NOTE — Assessment & Plan Note (Signed)
Clonazepam prn - rare

## 2017-06-22 DIAGNOSIS — M25572 Pain in left ankle and joints of left foot: Secondary | ICD-10-CM | POA: Diagnosis not present

## 2017-08-22 ENCOUNTER — Ambulatory Visit: Payer: Self-pay | Admitting: Psychology

## 2017-09-06 ENCOUNTER — Encounter: Payer: Self-pay | Admitting: Internal Medicine

## 2017-09-06 ENCOUNTER — Ambulatory Visit: Payer: BLUE CROSS/BLUE SHIELD | Admitting: Internal Medicine

## 2017-09-06 DIAGNOSIS — F988 Other specified behavioral and emotional disorders with onset usually occurring in childhood and adolescence: Secondary | ICD-10-CM

## 2017-09-06 DIAGNOSIS — F411 Generalized anxiety disorder: Secondary | ICD-10-CM

## 2017-09-06 MED ORDER — CLONAZEPAM 0.5 MG PO TABS: 0.5000 mg | ORAL_TABLET | Freq: Two times a day (BID) | ORAL | 1 refills | Status: DC | PRN

## 2017-09-06 MED ORDER — BUPROPION HCL ER (XL) 150 MG PO TB24: 150.0000 mg | ORAL_TABLET | Freq: Every day | ORAL | 5 refills | Status: DC

## 2017-09-06 NOTE — Assessment & Plan Note (Addendum)
Adderall - not taking Chronic   Potential benefits of a long term stimulants use as well as potential risks  and complications were explained to the patient and were aknowledged. Start Wellbutrin XR

## 2017-09-06 NOTE — Assessment & Plan Note (Addendum)
Clonazepam prn OCD traits - Wellbutrin. If not better - try Viibrid or Trintellix Chronic

## 2017-09-06 NOTE — Progress Notes (Signed)
Subjective:  Patient ID: Gary Jennings, male    DOB: 06/18/1990  Age: 27 y.o. MRN: [id number]  CC: No chief complaint on file.   HPI Gary ClientDylan S Joshua presents for an anxiety disorder, OCD traits, mind is racing. Not taking Adderall.  Outpatient Medications Prior to Visit  Medication Sig Dispense Refill  . amphetamine-dextroamphetamine (ADDERALL XR) 20 MG 24 hr capsule Take 1 capsule (20 mg total) by mouth daily. 30 capsule 0  . amphetamine-dextroamphetamine (ADDERALL XR) 20 MG 24 hr capsule Take 1 capsule (20 mg total) by mouth daily. 30 capsule 0  . amphetamine-dextroamphetamine (ADDERALL XR) 20 MG 24 hr capsule Take 1 capsule (20 mg total) by mouth daily. 30 capsule 0  . clonazePAM (KLONOPIN) 0.5 MG tablet Take 1 tablet (0.5 mg total) by mouth 2 (two) times daily as needed for anxiety. 30 tablet 3   No facility-administered medications prior to visit.     ROS: Review of Systems  Constitutional: Negative for appetite change, fatigue and unexpected weight change.  HENT: Negative for congestion, nosebleeds, sneezing, sore throat and trouble swallowing.   Eyes: Negative for itching and visual disturbance.  Respiratory: Negative for cough.   Cardiovascular: Negative for chest pain, palpitations and leg swelling.  Gastrointestinal: Negative for abdominal distention, blood in stool, diarrhea and nausea.  Genitourinary: Negative for frequency and hematuria.  Musculoskeletal: Negative for back pain, gait problem, joint swelling and neck pain.  Skin: Negative for rash.  Neurological: Negative for dizziness, tremors, speech difficulty and weakness.  Psychiatric/Behavioral: Positive for decreased concentration and dysphoric mood. Negative for agitation, behavioral problems, confusion, self-injury, sleep disturbance and suicidal ideas. The patient is nervous/anxious.     Objective:  BP 122/78 (BP Location: Left Arm, Patient Position: Sitting, Cuff Size: Large)   Pulse 60   Temp 98.5 F  (36.9 C) (Oral)   Ht 5\' 9"  (1.753 m)   Wt 179 lb (81.2 kg)   SpO2 98%   BMI 26.43 kg/m   BP Readings from Last 3 Encounters:  09/06/17 122/78  06/15/17 126/76  03/15/17 138/82    Wt Readings from Last 3 Encounters:  09/06/17 179 lb (81.2 kg)  06/15/17 176 lb (79.8 kg)  03/15/17 179 lb (81.2 kg)    Physical Exam  Constitutional: He is oriented to person, place, and time. He appears well-developed. No distress.  NAD  HENT:  Mouth/Throat: Oropharynx is clear and moist.  Eyes: Pupils are equal, round, and reactive to light. Conjunctivae are normal.  Neck: Normal range of motion. No JVD present. No thyromegaly present.  Cardiovascular: Normal rate, regular rhythm, normal heart sounds and intact distal pulses. Exam reveals no gallop and no friction rub.  No murmur heard. Pulmonary/Chest: Effort normal and breath sounds normal. No respiratory distress. He has no wheezes. He has no rales. He exhibits no tenderness.  Abdominal: Soft. Bowel sounds are normal. He exhibits no distension and no mass. There is no tenderness. There is no rebound and no guarding.  Musculoskeletal: Normal range of motion. He exhibits no edema or tenderness.  Lymphadenopathy:    He has no cervical adenopathy.  Neurological: He is alert and oriented to person, place, and time. He has normal reflexes. No cranial nerve deficit. He exhibits normal muscle tone. He displays a negative Romberg sign. Coordination and gait normal.  Skin: Skin is warm and dry. No rash noted.  Psychiatric: He has a normal mood and affect. His behavior is normal. Judgment and thought content normal.    Lab  Results  Component Value Date   WBC 10.0 07/15/2016   HGB 15.9 07/15/2016   HCT 45.7 07/15/2016   PLT 279.0 07/15/2016   GLUCOSE 96 07/15/2016   CHOL 152 07/15/2016   TRIG 35.0 07/15/2016   HDL 65.10 07/15/2016   LDLCALC 80 07/15/2016   ALT 19 07/15/2016   AST 16 07/15/2016   NA 138 07/15/2016   K 4.0 07/15/2016   CL 101  07/15/2016   CREATININE 0.98 07/15/2016   BUN 22 07/15/2016   CO2 28 07/15/2016   TSH 0.74 07/15/2016    No results found.  Assessment & Plan:   There are no diagnoses linked to this encounter.   No orders of the defined types were placed in this encounter.    Follow-up: No follow-ups on file.  Sonda Primes, MD

## 2017-09-12 ENCOUNTER — Ambulatory Visit: Payer: BLUE CROSS/BLUE SHIELD | Admitting: Psychology

## 2017-09-12 DIAGNOSIS — F411 Generalized anxiety disorder: Secondary | ICD-10-CM

## 2017-09-13 ENCOUNTER — Telehealth: Payer: Self-pay | Admitting: Internal Medicine

## 2017-09-13 NOTE — Telephone Encounter (Unsigned)
Copied from CRM [id number]. Topic: Quick Communication - See Telephone Encounter >> Sep 13, 2017 10:10 AM Raquel SarnaHayes, Teresa G wrote: buPROPion (WELLBUTRIN XL) 150 MG 24 hr tablet - pt is having side effects.  Please call pt asap. Pt wants to stop taking the Rx.

## 2017-09-13 NOTE — Telephone Encounter (Signed)
LMTCB

## 2017-09-13 NOTE — Telephone Encounter (Signed)
Pt stated that the Wellbutrin caused an increase in his anxiety to where he was having crying spells. Pt would like to know if there is something else he can take.  Pt stopped medication.

## 2017-09-14 MED ORDER — VILAZODONE HCL 20 MG PO TABS: 20.0000 mg | ORAL_TABLET | Freq: Every day | ORAL | 6 refills | Status: DC

## 2017-09-14 NOTE — Telephone Encounter (Signed)
Pt notified and will come by office to pick up starter pack and RX

## 2017-09-14 NOTE — Telephone Encounter (Signed)
Stop Wellbutrin Start Viibryd starter pack - pick up samples and a Rx Thx

## 2017-09-26 ENCOUNTER — Other Ambulatory Visit: Payer: Self-pay | Admitting: Internal Medicine

## 2017-09-26 NOTE — Telephone Encounter (Signed)
Patient came by the office today requesting a refill on his amphetamine-dextroamphetamine (ADDERALL XR) 20 MG 24 hr capsule to [facility] on [address]. He said that he forgot to mention it at his last visit with Dr Posey ReaPlotnikov (09/06/2017).

## 2017-09-27 MED ORDER — AMPHETAMINE-DEXTROAMPHET ER 20 MG PO CP24: 20.0000 mg | ORAL_CAPSULE | Freq: Every day | ORAL | 0 refills | Status: DC

## 2017-09-27 NOTE — Addendum Note (Signed)
Addended by: Scarlett PrestoFRIEDENBACH, Zyrell Carmean on: 09/27/2017 04:31 PM   Modules accepted: Orders

## 2017-09-27 NOTE — Telephone Encounter (Signed)
Ok Thx 

## 2017-09-27 NOTE — Telephone Encounter (Signed)
Notified pt MD sent refill to [facility] 

## 2017-09-28 MED ORDER — AMPHETAMINE-DEXTROAMPHET ER 20 MG PO CP24: 20.0000 mg | ORAL_CAPSULE | Freq: Every day | ORAL | 0 refills | Status: DC

## 2017-10-11 ENCOUNTER — Ambulatory Visit (INDEPENDENT_AMBULATORY_CARE_PROVIDER_SITE_OTHER): Payer: BLUE CROSS/BLUE SHIELD | Admitting: Psychology

## 2017-10-11 DIAGNOSIS — F411 Generalized anxiety disorder: Secondary | ICD-10-CM

## 2017-10-24 ENCOUNTER — Telehealth: Payer: Self-pay | Admitting: Internal Medicine

## 2017-10-24 NOTE — Telephone Encounter (Signed)
Original Rx was printed- can this be sent in- patient did not have follow up appointment as recommended. (Patient finished samples and wants to fill Rx)

## 2017-10-24 NOTE — Telephone Encounter (Signed)
Printed RX was with samples, LMTCB

## 2017-10-24 NOTE — Telephone Encounter (Signed)
Copied from CRM [id number]. Topic: Quick Communication - Rx Refill/Question >> Oct 24, 2017 10:02 AM Gary Jennings, Gary J wrote: Medication: Vilazodone HCl (VIIBRYD) 20 MG TABS  Has the patient contacted their pharmacy? Yes.   (Agent: If no, request that the patient contact the pharmacy for the refill.) (Agent: If yes, when and what did the pharmacy advise?)  Preferred Pharmacy (with phone number or street name): [facility] [place] Pt says he went to pharmacy and that they do not have record of this being called in ever.  Pt says he was given sample pack and is now out of medication. He is asking for this to be called in as soon as possible.    Agent: Please be advised that RX refills may take up to 3 business days. We ask that you follow-up with your pharmacy.

## 2017-10-24 NOTE — Telephone Encounter (Signed)
Patient does not have printed RX

## 2017-10-25 ENCOUNTER — Ambulatory Visit (INDEPENDENT_AMBULATORY_CARE_PROVIDER_SITE_OTHER): Payer: BLUE CROSS/BLUE SHIELD | Admitting: Psychology

## 2017-10-25 DIAGNOSIS — F411 Generalized anxiety disorder: Secondary | ICD-10-CM

## 2017-10-25 MED ORDER — VILAZODONE HCL 20 MG PO TABS: 20.0000 mg | ORAL_TABLET | Freq: Every day | ORAL | 6 refills | Status: DC

## 2017-10-25 NOTE — Telephone Encounter (Signed)
Pt needs a PA no VIIBRYD , please advise

## 2017-10-25 NOTE — Telephone Encounter (Signed)
Pt called to check the status of the Rx; contact to advise

## 2017-10-25 NOTE — Telephone Encounter (Signed)
Patient wants know if he can get a starter pack/sample of the medication sice he is going out of town today. Advised someone will call back to let him know and worst case we send to a pharmacy where he is traveling to.

## 2017-10-27 NOTE — Telephone Encounter (Signed)
Approved today CaseId:[id number];Status:Approved;Review Type:Prior Auth;Coverage Start Date:09/27/2017;Coverage End Date:10/27/2018;

## 2017-11-08 ENCOUNTER — Ambulatory Visit: Payer: BLUE CROSS/BLUE SHIELD | Admitting: Psychology

## 2017-11-08 DIAGNOSIS — F411 Generalized anxiety disorder: Secondary | ICD-10-CM | POA: Diagnosis not present

## 2017-11-29 ENCOUNTER — Ambulatory Visit: Payer: Self-pay | Admitting: Psychology

## 2017-12-01 ENCOUNTER — Other Ambulatory Visit: Payer: Self-pay | Admitting: Internal Medicine

## 2017-12-01 NOTE — Telephone Encounter (Signed)
Copied from CRM [id number]. Topic: Quick Communication - Rx Refill/Question >> Dec 01, 2017  9:11 AM Zada Girt, Lumin L wrote: Medication: amphetamine-dextroamphetamine (ADDERALL XR) 20 MG 24 hr capsule, clonazePAM (KLONOPIN) 0.5 MG tablet   Has the patient contacted their pharmacy? Yes.   (Agent: If no, request that the patient contact the pharmacy for the refill.) (Agent: If yes, when and what did the pharmacy advise?)  Preferred Pharmacy (with phone number or street name): [id number] [place], [place] - [address] AT [facility] [address] [place] [address] Phone: [phone] Fax: [phone]  Agent: Please be advised that RX refills may take up to 3 business days. We ask that you follow-up with your pharmacy.

## 2017-12-09 ENCOUNTER — Ambulatory Visit: Payer: BLUE CROSS/BLUE SHIELD | Admitting: Internal Medicine

## 2017-12-09 ENCOUNTER — Ambulatory Visit: Payer: Self-pay | Admitting: Internal Medicine

## 2017-12-09 ENCOUNTER — Encounter: Payer: Self-pay | Admitting: Internal Medicine

## 2017-12-09 DIAGNOSIS — L02411 Cutaneous abscess of right axilla: Secondary | ICD-10-CM | POA: Insufficient documentation

## 2017-12-09 MED ORDER — AMPHETAMINE-DEXTROAMPHET ER 20 MG PO CP24: 20.0000 mg | ORAL_CAPSULE | Freq: Every day | ORAL | 0 refills | Status: DC

## 2017-12-09 MED ORDER — SULFAMETHOXAZOLE-TRIMETHOPRIM 800-160 MG PO TABS: 1.0000 | ORAL_TABLET | Freq: Two times a day (BID) | ORAL | 0 refills | Status: DC

## 2017-12-09 MED ORDER — CLONAZEPAM 0.5 MG PO TABS: 0.5000 mg | ORAL_TABLET | Freq: Two times a day (BID) | ORAL | 0 refills | Status: DC | PRN

## 2017-12-09 NOTE — Telephone Encounter (Signed)
appt made

## 2017-12-09 NOTE — Progress Notes (Signed)
   Subjective:    Patient ID: Gary Jennings, male    DOB: 1990-08-08, 27 y.o.   MRN: [id number]  HPI The patient is a 27 YO man coming in for cyst under his right armpit. Started about 2-3 days ago. Was red and swollen. Played golf yesterday which he thinks bothered it. He had a lot of drainage from it overnight and it is hurting less this morning. Denies fevers or chills. Denies injury. Has not tried anything for it.   Review of Systems  Constitutional: Negative.   Respiratory: Negative for cough, chest tightness and shortness of breath.   Cardiovascular: Negative for chest pain, palpitations and leg swelling.  Gastrointestinal: Negative for abdominal distention, abdominal pain, constipation, diarrhea, nausea and vomiting.  Musculoskeletal: Positive for myalgias.  Skin: Positive for rash and wound.  Neurological: Negative.       Objective:   Physical Exam  Constitutional: He is oriented to person, place, and time. He appears well-developed and well-nourished.  HENT:  Head: Normocephalic and atraumatic.  Eyes: EOM are normal.  Neck: Normal range of motion.  Cardiovascular: Normal rate and regular rhythm.  Pulmonary/Chest: Effort normal and breath sounds normal. No respiratory distress. He has no wheezes. He has no rales.  Musculoskeletal: He exhibits no edema.  Neurological: He is alert and oriented to person, place, and time. Coordination normal.  Skin: Skin is warm and dry.  Right axilla abscess with spontaneous scant drainage, red rash about 3cm by 1cm, no appreciable fluid in the abscess at this time.   Psychiatric: He has a normal mood and affect.   Vitals:   12/09/17 1032  BP: 110/70  Pulse: (!) 58  Temp: 97.7 F (36.5 C)  TempSrc: Oral  SpO2: 98%  Weight: 182 lb (82.6 kg)  Height: 5\' 9"  (1.753 m)      Assessment & Plan:

## 2017-12-09 NOTE — Patient Instructions (Signed)
We have sent in bactrim to take 1 pill twice a day for 1 week. °

## 2017-12-09 NOTE — Telephone Encounter (Signed)
Pt states he travels a lot and has to call closer to the time he is due for OV - he was notified to f/u in 3 months - pt states pharmacy did not receive RX for clonazePAM (KLONOPIN) 0.5 MG tablet   Pt requesting to have this sent in  [facility] on [address] - [place]

## 2017-12-09 NOTE — Assessment & Plan Note (Signed)
Spontaneously drained. Rx for bactrim 1 week course.

## 2017-12-09 NOTE — Telephone Encounter (Signed)
OV q 3 mo 

## 2017-12-09 NOTE — Addendum Note (Signed)
Addended by: Scarlett Presto on: 12/09/2017 02:28 PM   Modules accepted: Orders

## 2018-01-30 ENCOUNTER — Encounter: Payer: Self-pay | Admitting: Internal Medicine

## 2018-01-30 ENCOUNTER — Ambulatory Visit: Payer: BLUE CROSS/BLUE SHIELD | Admitting: Internal Medicine

## 2018-01-30 DIAGNOSIS — F411 Generalized anxiety disorder: Secondary | ICD-10-CM | POA: Diagnosis not present

## 2018-01-30 DIAGNOSIS — F419 Anxiety disorder, unspecified: Secondary | ICD-10-CM | POA: Diagnosis not present

## 2018-01-30 MED ORDER — VILAZODONE HCL 20 MG PO TABS: 20.0000 mg | ORAL_TABLET | Freq: Every day | ORAL | 6 refills | Status: DC

## 2018-01-30 MED ORDER — AMPHETAMINE-DEXTROAMPHET ER 20 MG PO CP24: 20.0000 mg | ORAL_CAPSULE | Freq: Every day | ORAL | 0 refills | Status: DC

## 2018-01-30 MED ORDER — CLONAZEPAM 0.5 MG PO TABS: 0.5000 mg | ORAL_TABLET | Freq: Two times a day (BID) | ORAL | 0 refills | Status: DC | PRN

## 2018-01-30 NOTE — Assessment & Plan Note (Signed)
Adderall Potential benefits of a long term stimulants use as well as potential risks  and complications were explained to the patient and were aknowledged. 

## 2018-01-30 NOTE — Progress Notes (Signed)
Subjective:  Patient ID: Gary Jennings, male    DOB: 04/07/1990  Age: 27 y.o. MRN: [id number]  CC: No chief complaint on file.   HPI Gary Jennings presents for anxiety, ADD, depression f/u. Doing better  Outpatient Medications Prior to Visit  Medication Sig Dispense Refill  . amphetamine-dextroamphetamine (ADDERALL XR) 20 MG 24 hr capsule Take 1 capsule (20 mg total) by mouth daily. 30 capsule 0  . clonazePAM (KLONOPIN) 0.5 MG tablet Take 1 tablet (0.5 mg total) by mouth 2 (two) times daily as needed for anxiety. 30 tablet 0  . sulfamethoxazole-trimethoprim (BACTRIM DS,SEPTRA DS) 800-160 MG tablet Take 1 tablet by mouth 2 (two) times daily. 14 tablet 0  . Vilazodone HCl (VIIBRYD) 20 MG TABS Take 1 tablet (20 mg total) by mouth daily. 30 tablet 6  . amphetamine-dextroamphetamine (ADDERALL XR) 20 MG 24 hr capsule Take 1 capsule (20 mg total) by mouth daily. 30 capsule 0  . amphetamine-dextroamphetamine (ADDERALL XR) 20 MG 24 hr capsule Take 1 capsule (20 mg total) by mouth daily. 30 capsule 0   No facility-administered medications prior to visit.     ROS: Review of Systems  Constitutional: Negative for appetite change, fatigue and unexpected weight change.  HENT: Negative for congestion, nosebleeds, sneezing, sore throat and trouble swallowing.   Eyes: Negative for itching and visual disturbance.  Respiratory: Negative for cough.   Cardiovascular: Negative for chest pain, palpitations and leg swelling.  Gastrointestinal: Negative for abdominal distention, blood in stool, diarrhea and nausea.  Genitourinary: Negative for frequency and hematuria.  Musculoskeletal: Negative for back pain, gait problem, joint swelling and neck pain.  Skin: Negative for rash.  Neurological: Negative for dizziness, tremors, speech difficulty and weakness.  Psychiatric/Behavioral: Positive for decreased concentration and dysphoric mood. Negative for agitation, behavioral problems, sleep disturbance and  suicidal ideas. The patient is not nervous/anxious.     Objective:  BP 108/72 (BP Location: Left Arm, Patient Position: Sitting, Cuff Size: Large)   Pulse 62   Temp 98.2 F (36.8 C) (Oral)   Ht 5\' 9"  (1.753 m)   Wt 192 lb (87.1 kg)   SpO2 98%   BMI 28.35 kg/m   BP Readings from Last 3 Encounters:  01/30/18 108/72  12/09/17 110/70  09/06/17 122/78    Wt Readings from Last 3 Encounters:  01/30/18 192 lb (87.1 kg)  12/09/17 182 lb (82.6 kg)  09/06/17 179 lb (81.2 kg)    Physical Exam  Constitutional: He is oriented to person, place, and time. He appears well-developed. No distress.  NAD  HENT:  Mouth/Throat: Oropharynx is clear and moist.  Eyes: Pupils are equal, round, and reactive to light. Conjunctivae are normal.  Neck: Normal range of motion. No JVD present. No thyromegaly present.  Cardiovascular: Normal rate, regular rhythm, normal heart sounds and intact distal pulses. Exam reveals no gallop and no friction rub.  No murmur heard. Pulmonary/Chest: Effort normal and breath sounds normal. No respiratory distress. He has no wheezes. He has no rales. He exhibits no tenderness.  Abdominal: Soft. Bowel sounds are normal. He exhibits no distension and no mass. There is no tenderness. There is no rebound and no guarding.  Musculoskeletal: Normal range of motion. He exhibits no edema or tenderness.  Lymphadenopathy:    He has no cervical adenopathy.  Neurological: He is alert and oriented to person, place, and time. He has normal reflexes. No cranial nerve deficit. He exhibits normal muscle tone. He displays a negative Romberg sign. Coordination  and gait normal.  Skin: Skin is warm and dry. No rash noted.  Psychiatric: He has a normal mood and affect. His behavior is normal. Judgment and thought content normal.    Lab Results  Component Value Date   WBC 10.0 07/15/2016   HGB 15.9 07/15/2016   HCT 45.7 07/15/2016   PLT 279.0 07/15/2016   GLUCOSE 96 07/15/2016   CHOL 152  07/15/2016   TRIG 35.0 07/15/2016   HDL 65.10 07/15/2016   LDLCALC 80 07/15/2016   ALT 19 07/15/2016   AST 16 07/15/2016   NA 138 07/15/2016   K 4.0 07/15/2016   CL 101 07/15/2016   CREATININE 0.98 07/15/2016   BUN 22 07/15/2016   CO2 28 07/15/2016   TSH 0.74 07/15/2016    No results found.  Assessment & Plan:   There are no diagnoses linked to this encounter.   No orders of the defined types were placed in this encounter.    Follow-up: No follow-ups on file.  Sonda Primes, MD

## 2018-01-30 NOTE — Assessment & Plan Note (Signed)
Viibrid Better

## 2018-01-30 NOTE — Assessment & Plan Note (Signed)
Clonazepam prn - rare Viibrid - better

## 2018-03-02 ENCOUNTER — Ambulatory Visit: Payer: BLUE CROSS/BLUE SHIELD | Admitting: Family Medicine

## 2018-03-02 ENCOUNTER — Ambulatory Visit (INDEPENDENT_AMBULATORY_CARE_PROVIDER_SITE_OTHER): Payer: BLUE CROSS/BLUE SHIELD

## 2018-03-02 ENCOUNTER — Encounter: Payer: Self-pay | Admitting: Family Medicine

## 2018-03-02 VITALS — BP 118/90 | HR 53 | Temp 97.9°F | Ht 69.0 in | Wt 192.0 lb

## 2018-03-02 DIAGNOSIS — G8929 Other chronic pain: Secondary | ICD-10-CM

## 2018-03-02 DIAGNOSIS — M25561 Pain in right knee: Secondary | ICD-10-CM | POA: Diagnosis not present

## 2018-03-02 MED ORDER — DICLOFENAC SODIUM 2 % TD SOLN: 1.0000 "application " | Freq: Two times a day (BID) | TRANSDERMAL | 3 refills | Status: DC

## 2018-03-02 NOTE — Assessment & Plan Note (Signed)
Reports having a mechanical issue but no pain.  Unclear if this is related meniscus but no findings on ultrasound today.  He does have pain over the insertion of the patellar tendon to suspect patellar tendinitis. -Pennsaid. -Counseled on home exercise therapy and supportive care. -If no improvement consider imaging or physical therapy.

## 2018-03-02 NOTE — Patient Instructions (Signed)
Nice to meet you  Please try the pennsaid over the area for pain  Please try the exercises  Please see me back in 3-5 weeks if no better
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
## 2018-03-22 ENCOUNTER — Other Ambulatory Visit: Payer: Self-pay | Admitting: Internal Medicine

## 2018-06-14 ENCOUNTER — Telehealth: Payer: Self-pay | Admitting: Internal Medicine

## 2018-06-14 ENCOUNTER — Ambulatory Visit (INDEPENDENT_AMBULATORY_CARE_PROVIDER_SITE_OTHER): Payer: BLUE CROSS/BLUE SHIELD | Admitting: Family Medicine

## 2018-06-14 ENCOUNTER — Encounter: Payer: Self-pay | Admitting: Family Medicine

## 2018-06-14 VITALS — Ht 69.0 in | Wt 182.0 lb

## 2018-06-14 DIAGNOSIS — F411 Generalized anxiety disorder: Secondary | ICD-10-CM

## 2018-06-14 DIAGNOSIS — F988 Other specified behavioral and emotional disorders with onset usually occurring in childhood and adolescence: Secondary | ICD-10-CM

## 2018-06-14 MED ORDER — VILAZODONE HCL 20 MG PO TABS: 20.0000 mg | ORAL_TABLET | Freq: Every day | ORAL | 2 refills | Status: DC

## 2018-06-14 MED ORDER — CLONAZEPAM 0.5 MG PO TABS: 0.2500 mg | ORAL_TABLET | ORAL | 0 refills | Status: DC

## 2018-06-14 MED ORDER — AMPHETAMINE-DEXTROAMPHET ER 20 MG PO CP24: 20.0000 mg | ORAL_CAPSULE | Freq: Every day | ORAL | 0 refills | Status: DC

## 2018-06-14 NOTE — Telephone Encounter (Signed)
Gary Jennings can you help with getting this pt scheduled? If just wanting to establish care/CPE schedule pt out if pt has an issue needing addressed can you set up doxy?  Thank you!

## 2018-06-14 NOTE — Telephone Encounter (Signed)
Ok with me 

## 2018-06-14 NOTE — Telephone Encounter (Signed)
Patient wants to transfer care from Dr Macarthur Critchley Plotnikov to Dr Luana Shu, Patient said [facility] is closer.

## 2018-06-14 NOTE — Progress Notes (Signed)
Virtual Visit via Video Note  I connected with Gary Jennings on 06/14/18 at  3:00 PM EDT by a video enabled telemedicine application and verified that I am speaking with the correct person using two identifiers. Location patient: home Location provider: home office Persons participating in the virtual visit: patient, provider  I discussed the limitations of evaluation and management by telemedicine and the availability of in person appointments. The patient expressed understanding and agreed to proceed.  Chief Complaint  Patient presents with  . Establish Care    TOC to Dr. Salena Saner all medications refill.      HPI: Gary Jennings is a 28 y.o. male who is transferring to our practice from Dr. Posey Rea due to our office being closer to home/work for the pt. He states he needs refills of his current medications for ADD and anxiety which include Adderall XR 20mg  daily, Viibryd 20mg  daily, and clonazepam 0.25mg  qAM. He has been on current med regimen x 7-8 mo and feels it is effective at managing his symptoms. He was tried on lexapro and another med in the past (he can't recall name) but has "side effects" right away.  GAD 7 : Generalized Anxiety Score 06/14/2018  Nervous, Anxious, on Edge 1  Control/stop worrying 0  Worry too much - different things 1  Trouble relaxing 0  Restless 1  Easily annoyed or irritable 0  Afraid - awful might happen 0  Total GAD 7 Score 3      Patient Active Problem List   Diagnosis Date Noted  . Abscess of right axilla 12/09/2017  . Cellulitis 03/15/2017  . Generalized anxiety disorder 09/06/2016  . Anxiety 08/27/2016  . Sunburn 06/29/2016  . Hair loss 03/16/2016  . Exposure to potentially hazardous body fluids 08/08/2015  . Molluscum contagiosum 08/08/2015  . Right ankle sprain 06/01/2012  . Conjunctivitis 05/29/2012  . LLQ abdominal pain 12/03/2011  . Left-sided chest wall pain 12/03/2011  . Knee pain, right 07/20/2011  . Knee pain 07/09/2011  .  SINUSITIS- ACUTE-NOS 07/10/2008  . GERD 01/03/2008  . Attention deficit disorder 04/17/2007  . ACNE 04/17/2007    Past Medical History:  Diagnosis Date  . Acne   . ADD (attention deficit disorder)     No past surgical history on file.  Family History  Problem Relation Age of Onset  . COPD Father     Social History   Tobacco Use  . Smoking status: Never Smoker  . Smokeless tobacco: Former Engineer, water Use Topics  . Alcohol use: Yes    Comment: social  . Drug use: No     Current Outpatient Medications:  .  amphetamine-dextroamphetamine (ADDERALL XR) 20 MG 24 hr capsule, Take 1 capsule (20 mg total) by mouth daily., Disp: 30 capsule, Rfl: 0 .  clonazePAM (KLONOPIN) 0.5 MG tablet, TAKE 1 TABLET BY MOUTH TWICE DAILY AS NEEDED FOR ANXIETY, Disp: 30 tablet, Rfl: 0 .  Vilazodone HCl (VIIBRYD) 20 MG TABS, Take 1 tablet (20 mg total) by mouth daily., Disp: 30 tablet, Rfl: 6 .  Diclofenac Sodium (PENNSAID) 2 % SOLN, Place 1 application onto the skin 2 (two) times daily. (Patient not taking: Reported on 06/14/2018), Disp: 1 Bottle, Rfl: 3  Allergies  Allergen Reactions  . Lexapro [Escitalopram Oxalate]     Suicidal thoughts  . Ceftin [Cefuroxime Axetil]     n/v  . Wellbutrin [Bupropion]     Anxiety, crying spells      ROS: See pertinent positives and  negatives per HPI.   EXAM:  Ht 5\' 9"  (1.753 m)   Wt 182 lb (82.6 kg) Comment: pt report  BMI 26.88 kg/m    GENERAL: alert, oriented, appears well and in no acute distress  NECK: normal movements of the head and neck  LUNGS: on inspection no signs of respiratory distress, breathing rate appears normal, no obvious gross SOB, gasping or wheezing, no conversational dyspnea  CV: no obvious cyanosis  MS: moves all visible extremities without noticeable abnormality  PSYCH/NEURO: pleasant and cooperative, speech and thought processing grossly intact   ASSESSMENT AND PLAN:  1. Generalized anxiety disorder - GAD-7 =  3, well-controlled - PMP Aware reviewed and appropriate Refill: - Vilazodone HCl (VIIBRYD) 20 MG TABS; Take 1 tablet (20 mg total) by mouth daily.  Dispense: 30 tablet; Refill: 2 - clonazePAM (KLONOPIN) 0.5 MG tablet; Take 0.5 tablets (0.25 mg total) by mouth every morning. for anxiety  Dispense: 45 tablet; Refill: 0 - pt is agreeable and willing to sign controlled substance agreement at next (in-person) OV and will f/u q2050mo and PRN - next appt in 3 mo  2. Attention deficit disorder (ADD) without hyperactivity - well-controlled on current med - PMP Aware reviewed and appropriate Refill: - amphetamine-dextroamphetamine (ADDERALL XR) 20 MG 24 hr capsule; Take 1 capsule (20 mg total) by mouth daily.  Dispense: 30 capsule; Refill: 0 - pt is agreeable and willing to sign controlled substance agreement at next (in-person) OV and will f/u q7950mo and PRN - next appt in 3 mo    I discussed the assessment and treatment plan with the patient. The patient was provided an opportunity to ask questions and all were answered. The patient agreed with the plan and demonstrated an understanding of the instructions.   The patient was advised to call back or seek an in-person evaluation if the symptoms worsen or if the condition fails to improve as anticipated.   Luana ShuMary Clemmie Buelna, DO

## 2018-06-14 NOTE — Telephone Encounter (Signed)
Ok w/me Thx 

## 2018-06-14 NOTE — Telephone Encounter (Signed)
I will.

## 2018-08-15 ENCOUNTER — Telehealth: Payer: Self-pay | Admitting: Family Medicine

## 2018-08-15 DIAGNOSIS — F988 Other specified behavioral and emotional disorders with onset usually occurring in childhood and adolescence: Secondary | ICD-10-CM

## 2018-08-15 DIAGNOSIS — F411 Generalized anxiety disorder: Secondary | ICD-10-CM

## 2018-08-15 NOTE — Telephone Encounter (Unsigned)
Copied from [place] [phone]. Topic: Quick Communication - Rx Refill/Question >> Aug 15, 2018  9:08 AM Gary Jennings wrote: Medication: Clonazepam, adderall  Has the patient contacted their pharmacy? No. (Agent: If no, request that the patient contact the pharmacy for the refill.) (Agent: If yes, when and what did the pharmacy advise?)  Preferred Pharmacy (with phone number or street name): [facility] [place], [address] [place] [address] Phone: [phone] Fax: [phone] Not [facility] 24 hour [facility]; exact hours not known.    Agent: Please be advised that RX refills may take up to 3 business days. We ask that you follow-up with your pharmacy.

## 2018-08-15 NOTE — Telephone Encounter (Signed)
Dr. Loletha Grayer please advise pt requests klonopin and adderall. Last refilled 4/15 klonopin #45 no refills adderall #30 no refills. No scheduled OV.  Last OV 4/15

## 2018-08-16 MED ORDER — AMPHETAMINE-DEXTROAMPHET ER 20 MG PO CP24: 20.0000 mg | ORAL_CAPSULE | Freq: Every day | ORAL | 0 refills | Status: DC

## 2018-08-16 NOTE — Telephone Encounter (Signed)
Adderall refilled as requested for 30 days.  Klonopin 0.5mg  tabs 1/2 tab po daily in AM #45 was Rx'd on 4/15 and should be a 90 day supply so I did not refill this. If is out of med, then he needs appt to discuss. If not, then he needs regular 80mo f/u appt next month 08/2018

## 2018-08-16 NOTE — Telephone Encounter (Signed)
Left pt VM to call back, Need to inform pt of notes below.

## 2018-08-18 ENCOUNTER — Encounter: Payer: Self-pay | Admitting: Family Medicine

## 2018-08-18 ENCOUNTER — Other Ambulatory Visit: Payer: Self-pay | Admitting: Family Medicine

## 2018-08-18 ENCOUNTER — Ambulatory Visit (INDEPENDENT_AMBULATORY_CARE_PROVIDER_SITE_OTHER): Payer: BC Managed Care – PPO | Admitting: Family Medicine

## 2018-08-18 DIAGNOSIS — F411 Generalized anxiety disorder: Secondary | ICD-10-CM

## 2018-08-18 DIAGNOSIS — F419 Anxiety disorder, unspecified: Secondary | ICD-10-CM | POA: Diagnosis not present

## 2018-08-18 MED ORDER — CLONAZEPAM 0.5 MG PO TABS: 0.2500 mg | ORAL_TABLET | Freq: Every day | ORAL | 0 refills | Status: DC | PRN

## 2018-08-18 NOTE — Progress Notes (Signed)
Virtual Visit via Video Note  I connected with Gary Jennings on 08/18/18 at  3:30 PM EDT by a video enabled telemedicine application and verified that I am speaking with the correct person using two identifiers. Location patient: home Location provider: home office Persons participating in the virtual visit: patient, provider  I discussed the limitations of evaluation and management by telemedicine and the availability of in person appointments. The patient expressed understanding and agreed to proceed.  Chief Complaint  Patient presents with  . Medication Refill    klonopin    HPI: Gary Jennings is a 28 y.o. male to discuss anxiety and refill of klonopin. Pt established care with me on 06/14/18 and his medications including klonopin 0.5mg  tabs 1/2 tab po qAM PRN #45 0RF was sent and filled on that date. This is a 90 day supply. Pt requested refill of klonopin on 08/15/18. Pt states 2-3x/wk he takes 1/2 tab in PM in addition to AM. Pt states it was prescribed as PRN by previous PCP but over time pt started taking it daily because he felt calmer when taking it. He is agreeable to weaning off this med, as his anxiety is well-controlled. He has not had a panic attack in years.  He is on viibryd 20mg  daily.   GAD 7 : Generalized Anxiety Score 08/18/2018 06/14/2018  Nervous, Anxious, on Edge 0 1  Control/stop worrying 0 0  Worry too much - different things 0 1  Trouble relaxing 1 0  Restless 0 1  Easily annoyed or irritable 0 0  Afraid - awful might happen 0 0  Total GAD 7 Score 1 3      Past Medical History:  Diagnosis Date  . Acne   . ADD (attention deficit disorder)     History reviewed. No pertinent surgical history.  Family History  Problem Relation Age of Onset  . COPD Father     Social History   Tobacco Use  . Smoking status: Never Smoker  . Smokeless tobacco: Former Network engineer Use Topics  . Alcohol use: Yes    Comment: social  . Drug use: No     Current  Outpatient Medications:  .  amphetamine-dextroamphetamine (ADDERALL XR) 20 MG 24 hr capsule, Take 1 capsule (20 mg total) by mouth daily., Disp: 30 capsule, Rfl: 0 .  clonazePAM (KLONOPIN) 0.5 MG tablet, Take 0.5 tablets (0.25 mg total) by mouth daily as needed for anxiety., Disp: 15 tablet, Rfl: 0 .  Vilazodone HCl (VIIBRYD) 20 MG TABS, Take 1 tablet (20 mg total) by mouth daily., Disp: 30 tablet, Rfl: 2  Allergies  Allergen Reactions  . Lexapro [Escitalopram Oxalate]     Suicidal thoughts  . Ceftin [Cefuroxime Axetil]     n/v  . Wellbutrin [Bupropion]     Anxiety, crying spells      ROS: See pertinent positives and negatives per HPI.   EXAM:  VITALS per patient if applicable: There were no vitals taken for this visit.   GENERAL: alert, oriented, appears well and in no acute distress  NECK: normal movements of the head and neck  LUNGS: on inspection no signs of respiratory distress, breathing rate appears normal, no obvious gross SOB, gasping or wheezing, no conversational dyspnea  CV: no obvious cyanosis  PSYCH/NEURO: pleasant and cooperative, no obvious depression or anxiety, speech and thought processing grossly intact   ASSESSMENT AND PLAN:  1. Generalized anxiety disorder - GAD-7 = 1 (3 in 05/2018) - discussed dependency  potential with benzos and that I am not comfortable with pt taking this on a daily basis. Pt understands the concern and is agreeable to tapering off klonopin. Pt will take 0.25mg  (1/2 of 0.5mg  tab) every other day x 1-2 wks then every third day x 1 week then stop. He understands that I will write Rx for 15 tabs and will not write another Rx for klonopin or other benzo.  - PMP Aware reviewed and consistent/appropriate Refill: - clonazePAM (KLONOPIN) 0.5 MG tablet; Take 0.5 tablets (0.25 mg total) by mouth daily as needed for anxiety.  Dispense: 15 tablet; Refill: 0 - cont viibryd - f/u in 3 mo or sooner PRN   I discussed the assessment and  treatment plan with the patient. The patient was provided an opportunity to ask questions and all were answered. The patient agreed with the plan and demonstrated an understanding of the instructions.   The patient was advised to call back or seek an in-person evaluation if the symptoms worsen or if the condition fails to improve as anticipated.   Luana ShuMary , DO

## 2018-08-18 NOTE — Telephone Encounter (Signed)
Pt made appointment for med refill.

## 2018-08-29 ENCOUNTER — Other Ambulatory Visit: Payer: Self-pay | Admitting: Family Medicine

## 2018-08-29 DIAGNOSIS — F411 Generalized anxiety disorder: Secondary | ICD-10-CM

## 2018-08-29 NOTE — Telephone Encounter (Signed)
Medication Refill - Medication: Vilazodone HCl (VIIBRYD) 20 MG TABS [[id number]]   Has the patient contacted their pharmacy? yes Preferred Pharmacy (with phone number or street name): [id number] - [place], [address] AT [place] [phone] (Phone) [phone] (Fax)     Agent: Please be advised that RX refills may take up to 3 business days. We ask that you follow-up with your pharmacy.

## 2018-08-30 ENCOUNTER — Other Ambulatory Visit: Payer: Self-pay | Admitting: Family Medicine

## 2018-08-30 DIAGNOSIS — F411 Generalized anxiety disorder: Secondary | ICD-10-CM

## 2018-08-30 MED ORDER — VIIBRYD 20 MG PO TABS: 20.0000 mg | ORAL_TABLET | Freq: Every day | ORAL | 2 refills | Status: DC

## 2018-08-30 MED ORDER — VIIBRYD 20 MG PO TABS: 20.0000 mg | ORAL_TABLET | Freq: Every day | ORAL | 1 refills | Status: DC

## 2018-08-30 NOTE — Telephone Encounter (Signed)
Pt is calling and he needs #90 day supply for 3 month of vilazodone per insurance company the med will be cheaper. [facility] [address]

## 2018-08-30 NOTE — Telephone Encounter (Signed)
Rx sent in

## 2018-08-30 NOTE — Telephone Encounter (Signed)
Rx resent for 90 days

## 2018-10-16 ENCOUNTER — Telehealth: Payer: Self-pay | Admitting: Family Medicine

## 2018-10-16 DIAGNOSIS — F988 Other specified behavioral and emotional disorders with onset usually occurring in childhood and adolescence: Secondary | ICD-10-CM

## 2018-10-16 NOTE — Telephone Encounter (Signed)
adderall last refilled 6/17 #30 Klonopin 6/19 #15 last OV 6/19 no follow up scheduled

## 2018-10-16 NOTE — Telephone Encounter (Signed)
amphetamine-dextroamphetamine (ADDERALL XR) 20 MG 24 hr capsule\   clonazePAM (KLONOPIN) 0.5 MG tablet   [facility]

## 2018-10-18 MED ORDER — AMPHETAMINE-DEXTROAMPHET ER 20 MG PO CP24: 20.0000 mg | ORAL_CAPSULE | Freq: Every day | ORAL | 0 refills | Status: DC

## 2018-10-18 NOTE — Telephone Encounter (Signed)
Spoke with pt made pt aware of medication refill. Pt made an appointment wants to discuss klonopin refill.

## 2018-10-18 NOTE — Telephone Encounter (Signed)
Will refill adderall as requested. Pt needs f/u VV appt in 1 mo (q71mo f/u is required). At last VV, we agreed pt would taper off and stop klonopin so I will not refill this med.

## 2018-10-20 ENCOUNTER — Ambulatory Visit: Payer: BC Managed Care – PPO | Admitting: Family Medicine

## 2018-10-20 ENCOUNTER — Encounter: Payer: Self-pay | Admitting: Family Medicine

## 2018-10-20 ENCOUNTER — Other Ambulatory Visit: Payer: Self-pay

## 2018-10-20 DIAGNOSIS — F411 Generalized anxiety disorder: Secondary | ICD-10-CM | POA: Diagnosis not present

## 2018-10-20 NOTE — Progress Notes (Signed)
Chief Complaint  Patient presents with   Medication Refill    HPI: Gary Jennings is a 28 y.o. male here to discuss concerns about anxiety/depression. Pt last seen by me on 08/18/18 for f/u on anxiety and to discuss med refills. Pts GAD-7 score at that time was 1 (previously 3 in 05/2018). Pt established care with me in 05/2018 and was taking klonopin 0.5mg  1/2 tab qAM PRN as Rx'd by previous PCP. This Rx was refilled with 45 tabs (90 day supply at that time). Pt requested refill on 08/15/18 and at that time he told me is was taking an additional tab 2-3x/wk in the PM. He felt anxiety is well controlled, no panic attacks in years. He is on Viibryd 20mg  daily. We agreed to taper off klonopin and he was given Rx for klonopin 0.5mg  #15 along with taper scheduled. I said I would no longer Rx or refill this Rx and pt expressed understanding.  Today, pt states he did taper off the klonopin but feels his anxiety increased off of it in the past week. He had a panic attack last week which required him to leave work.   He runs to relieve stress and finds this helpful.    Depression screen [facility] 2/9 10/20/2018 06/14/2018 06/29/2016  Decreased Interest 0 0 0  Down, Depressed, Hopeless 0 0 0  PHQ - 2 Score 0 0 0  Altered sleeping 0 0 -  Tired, decreased energy 0 0 -  Change in appetite 0 0 -  Feeling bad or failure about yourself  0 0 -  Trouble concentrating 1 1 -  Moving slowly or fidgety/restless 0 0 -  Suicidal thoughts 0 0 -  PHQ-9 Score 1 1 -    GAD 7 : Generalized Anxiety Score 10/20/2018 08/18/2018 06/14/2018  Nervous, Anxious, on Edge 2 0 1  Control/stop worrying 2 0 0  Worry too much - different things 2 0 1  Trouble relaxing 2 1 0  Restless 2 0 1  Easily annoyed or irritable 2 0 0  Afraid - awful might happen 2 0 0  Total GAD 7 Score 14 1 3       Past Medical History:  Diagnosis Date   Acne    ADD (attention deficit disorder)     History reviewed. No pertinent surgical  history.  Social History   Socioeconomic History   Marital status: Single    Spouse name: Not on file   Number of children: Not on file   Years of education: Not on file   Highest education level: Not on file  Occupational History   Not on file  Social Needs   Financial resource strain: Not on file   Food insecurity    Worry: Not on file    Inability: Not on file   Transportation needs    Medical: Not on file    Non-medical: Not on file  Tobacco Use   Smoking status: Never Smoker   Smokeless tobacco: Former NeurosurgeonUser  Substance and Sexual Activity   Alcohol use: Yes    Comment: social   Drug use: No   Sexual activity: Not on file  Lifestyle   Physical activity    Days per week: Not on file    Minutes per session: Not on file   Stress: Not on file  Relationships   Social connections    Talks on phone: Not on file    Gets together: Not on file  Attends religious service: Not on file    Active member of club or organization: Not on file    Attends meetings of clubs or organizations: Not on file    Relationship status: Not on file   Intimate partner violence    Fear of current or ex partner: Not on file    Emotionally abused: Not on file    Physically abused: Not on file    Forced sexual activity: Not on file  Other Topics Concern   Not on file  Social History Narrative   Parents are devoiced; lives with mother   Single   Regular exercise-gulf   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   Vilazodone HCl (VIIBRYD) 20 MG TABS Take 1 tablet (20 mg total) by mouth daily.   No facility-administered encounter  medications on file as of 10/20/2018.      ROS: Gen: no fever, chills  Resp: no cough, wheeze,SOB CV: no CP, palpitations, LE edema,  GI: no heartburn, n/v/d/c, abd pain GU: no dysuria, urgency, frequency, hematuria  MSK: no joint pain, myalgias, back pain Neuro: no dizziness, headache, weakness, vertigo Psych: as above in HPI   Allergies  Allergen Reactions   Lexapro [Escitalopram Oxalate]     Suicidal thoughts   Ceftin [Cefuroxime Axetil]     n/v   Wellbutrin [Bupropion]     Anxiety, crying spells    BP 120/82    Pulse (!) 58    Temp 98 F (36.7 C) (Oral)    Ht 5\' 9"  (1.753 m)    Wt 190 lb 3.2 oz (86.3 kg)    SpO2 96%    BMI 28.09 kg/m   Physical Exam  Constitutional: He is oriented to person, place, and time. He appears well-developed and well-nourished. No distress.  Neck: No thyromegaly present.  Cardiovascular: Normal rate, regular rhythm and normal heart sounds.  Pulmonary/Chest: Effort normal and breath sounds normal. No respiratory distress.  Neurological: He is alert and oriented to person, place, and time.  Psychiatric: He has a normal mood and affect. His behavior is normal.     A/P:  1. Generalized anxiety disorder - off klonopin x 3-4 days and pt notes increased anxiety in the past week including 1 panic attack 1+ week ago - GAD-7 = 14 (previous 1 and before that 4) - I again discussed with pt that I do not agree with the regular use of klonopin or other benzos and do not feel comfortable continuing to prescribe this for him. I presented the options of daily buspar or prn hydroxyzine to help treat his anxiety. Pt ultimately decided to not take anything at this time and "see how it goes for a week or two". He declines referral for [facility] counseling. I stressed the importance of controlling anxiety and based on his GAD-7 score and description of symptoms, I think he would benefit from trial of buspar. He will think about options and get back to me.

## 2018-10-20 NOTE — Patient Instructions (Addendum)
Buspar daily Hydroxyzine as needed

## 2018-10-26 DIAGNOSIS — U071 COVID-19: Secondary | ICD-10-CM | POA: Diagnosis not present

## 2018-10-26 DIAGNOSIS — B349 Viral infection, unspecified: Secondary | ICD-10-CM | POA: Diagnosis not present

## 2018-11-10 ENCOUNTER — Other Ambulatory Visit: Payer: Self-pay

## 2018-11-10 DIAGNOSIS — R6889 Other general symptoms and signs: Secondary | ICD-10-CM | POA: Diagnosis not present

## 2018-11-10 DIAGNOSIS — Z20822 Contact with and (suspected) exposure to covid-19: Secondary | ICD-10-CM

## 2018-11-11 LAB — NOVEL CORONAVIRUS, NAA: SARS-CoV-2, NAA: NOT DETECTED

## 2019-01-01 ENCOUNTER — Other Ambulatory Visit: Payer: Self-pay | Admitting: Family Medicine

## 2019-01-01 DIAGNOSIS — F411 Generalized anxiety disorder: Secondary | ICD-10-CM

## 2019-01-01 MED ORDER — VIIBRYD 20 MG PO TABS: 20.0000 mg | ORAL_TABLET | Freq: Every day | ORAL | 1 refills | Status: DC

## 2019-01-01 NOTE — Telephone Encounter (Signed)
Medication Refill - Medication: Vilazodone HCl (VIIBRYD) 20 MG TABS  Patient is all out of medication and would like a 90-day supply  Preferred Pharmacy (with phone number or street name):  [id number] - [place], [address] AT [place] [phone] (Phone) [phone] (Fax)     Agent: Please be advised that RX refills may take up to 3 business days. We ask that you follow-up with your pharmacy.

## 2019-01-01 NOTE — Telephone Encounter (Signed)
Requested medication (s) are due for refill today: yes  Requested medication (s) are on the active medication list: yes  Last refill:  10/18/2018  Future visit scheduled: no  Notes to clinic: Patient requesting 90 day    Requested Prescriptions  Pending Prescriptions Disp Refills   Vilazodone HCl (VIIBRYD) 20 MG TABS 90 tablet 1    Sig: Take 1 tablet (20 mg total) by mouth daily.     Psychiatry:  Antidepressants - SSRI Failed - 01/01/2019  8:58 AM      Failed - Completed PHQ-2 or PHQ-9 in the last 360 days.      Passed - Valid encounter within last 6 months    Recent Outpatient Visits          2 months ago Generalized anxiety disorder   [facility] [place], [place], [place]   4 months ago Anxiety   [facility] [place], [place] K, [place]   6 months ago Generalized anxiety disorder   [facility] [place], [place], [place]   10 months ago Chronic pain of right knee   [facility] Rosemarie Ax, MD   11 months ago [place], Evie Lacks, MD

## 2019-01-04 ENCOUNTER — Encounter: Payer: Self-pay | Admitting: Family Medicine

## 2019-01-04 ENCOUNTER — Other Ambulatory Visit: Payer: Self-pay

## 2019-01-04 ENCOUNTER — Ambulatory Visit: Payer: BC Managed Care – PPO | Admitting: Family Medicine

## 2019-01-04 ENCOUNTER — Ambulatory Visit (INDEPENDENT_AMBULATORY_CARE_PROVIDER_SITE_OTHER): Payer: BC Managed Care – PPO

## 2019-01-04 VITALS — BP 118/84 | HR 70 | Temp 98.7°F | Ht 69.0 in | Wt 194.0 lb

## 2019-01-04 DIAGNOSIS — Z8616 Personal history of COVID-19: Secondary | ICD-10-CM

## 2019-01-04 DIAGNOSIS — Z8619 Personal history of other infectious and parasitic diseases: Secondary | ICD-10-CM

## 2019-01-04 DIAGNOSIS — R1032 Left lower quadrant pain: Secondary | ICD-10-CM

## 2019-01-04 DIAGNOSIS — R0781 Pleurodynia: Secondary | ICD-10-CM | POA: Diagnosis not present

## 2019-01-04 DIAGNOSIS — R079 Chest pain, unspecified: Secondary | ICD-10-CM | POA: Diagnosis not present

## 2019-01-04 LAB — TROPONIN I (HIGH SENSITIVITY): High Sens Troponin I: <2 ng/L (ref 2–17)

## 2019-01-04 MED ORDER — IBUPROFEN 600 MG PO TABS: 600.0000 mg | ORAL_TABLET | Freq: Three times a day (TID) | ORAL | 0 refills | Status: DC

## 2019-01-04 NOTE — Progress Notes (Signed)
Gary Jennings is a 28 y.o. male  Chief Complaint  Patient presents with  . Chest Pain    constant left side pain and mid back pain when taking deep breath.    HPI: Gary Jennings is a 28 y.o. male complains of 1 week h/o Lt lower abdominal pain, worsening over that time. He describes pain as a "pressure". No n/v/d. Pt states he has not had BM as frequently over the past week. He is able to have BM, last BM this AM and prior to that it was yesterday. He typically has 3-4 BM per day and recent stools are smaller volume. No blood in stool. No fever, chills. + increased flatus. Appetite is normal and diet has not changed. Pt is drinking plenty of water. He started taking supplement this week to help with getting body into "ketosis state".   Pain is not keeping patient up at night or waking him up at night. Pt has not taken anything for the pain.   Pain in his mid back with deep breath, specifically exhalation x few weeks. No pain at rest. No SOB, DOE. He was able to run 4 miles last week without issue, but states he was more out of breath then normal when pushing a wheelbarrow while helping his dad. No LE edema. No cough at this time, although he has one for months that resolved about 2 weeks ago. He had covid in 08/2018.  Pt notes family h/o "lung issues" with father, PGM, cousin. He is unsure of specific diagnosis.   No personal or family h/o DVT/PE/clotting disorder. No recent surgery or recent travel or prolonged immobility.  Past Medical History:  Diagnosis Date  . Acne   . ADD (attention deficit disorder)     History reviewed. No pertinent surgical history.  Social History   Socioeconomic History  . Marital status: Single    Spouse name: Not on file  . Number of children: Not on file  . Years of education: Not on file  . Highest education level: Not on file  Occupational History  . Not on file  Social Needs  . Financial resource strain: Not on file  . Food insecurity   Worry: Not on file    Inability: Not on file  . Transportation needs    Medical: Not on file    Non-medical: Not on file  Tobacco Use  . Smoking status: Never Smoker  . Smokeless tobacco: Former Network engineer and Sexual Activity  . Alcohol use: Yes    Comment: social  . Drug use: No  . Sexual activity: Not on file  Lifestyle  . Physical activity    Days per week: Not on file    Minutes per session: Not on file  . Stress: Not on file  Relationships  . Social Herbalist on phone: Not on file    Gets together: Not on file    Attends religious service: Not on file    Active member of club or organization: Not on file    Attends meetings of clubs or organizations: Not on file    Relationship status: Not on file  . Intimate partner violence    Fear of current or ex partner: Not on file    Emotionally abused: Not on file    Physically abused: Not on file    Forced sexual activity: Not on file  Other Topics Concern  . 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 COPD Father      Immunization History  Administered Date(s) Administered  . Tdap 06/30/2014    Outpatient Encounter Medications as of 01/04/2019  Medication Sig  . amphetamine-dextroamphetamine (ADDERALL XR) 20 MG 24 hr capsule Take 1 capsule (20 mg total) by mouth daily.  . Vilazodone HCl (VIIBRYD) 20 MG TABS Take 1 tablet (20 mg total) by mouth daily.   No facility-administered encounter medications on file as of 01/04/2019.      ROS: Pertinent positives and negatives noted in HPI. Remainder of ROS non-contributory   Allergies  Allergen Reactions  . Lexapro [Escitalopram Oxalate]     Suicidal thoughts  . Ceftin [Cefuroxime Axetil]     n/v  . Wellbutrin [Bupropion]     Anxiety, crying spells    BP 118/84   Pulse 70    Temp 98.7 F (37.1 C) (Temporal)   Ht 5\' 9"  (1.753 m)   Wt 194 lb (88 kg)   SpO2 97%   BMI 28.65 kg/m    Physical Exam  Constitutional: He is oriented to person, place, and time. He appears well-developed and well-nourished. No distress.  Neck: Neck supple.  Cardiovascular: Normal rate, regular rhythm, normal heart sounds and intact distal pulses. Exam reveals no friction rub.  No murmur heard. Pulmonary/Chest: Effort normal and breath sounds normal. No respiratory distress. He has no wheezes. He has no rhonchi. He exhibits no tenderness.  Abdominal: Soft. Bowel sounds are normal. He exhibits no distension and no mass. There is no abdominal tenderness.  Musculoskeletal:        General: No edema.     Comments: Back without any TTP although pt reports sharp pain in various areas (can be mid back, low back, lateral chest) with exhalation following deep inspiration. Pt denies pain when he sits up and leans forward.  Lymphadenopathy:    He has no cervical adenopathy.  Neurological: He is alert and oriented to person, place, and time.  Psychiatric: He has a normal mood and affect. His behavior is normal.     A/P:  1. Pleuritic chest pain 2. History of 2019 novel coronavirus disease (COVID-19) - vitals stable - normal HR and pulse ox = 97% - normal lung exam - pain can be located in back or chest, sharp as per pt - EKG 12-Lead - NSR @ 55bpm,no acute changes - DG Chest 2 View - CBC - C-reactive protein - Sedimentation rate - Troponin I Rx: - ibuprofen (ADVIL) 600 MG tablet; Take 1 tablet (600 mg total) by mouth 3 (three) times daily.  Dispense: 45 tablet; Refill: 0 - pt to take TID w/ meals x 7-10 days then stop - f/u if symptoms do not improve in 2 wks, sooner if they worsen  3. Left lower quadrant abdominal pain - no fever, chills, blood in stool, diarrhea - smaller, harder, less frequent stools x 1 week so pain could be d/t constipation and increased stool burden -  recommended miralax BID x 3-5 days along with colace BID with goal a a few soft, large volume stools - f/u if no/minimal improvement with above in the next 7-10 days or sooner if symptoms worsen or new symptoms (fever, chills, blood in stool) develop  Pt expressed understanding. All questions answered.

## 2019-01-04 NOTE — Patient Instructions (Addendum)
Miralax 1 capful in 6-8oz of water twice per day for 3-4 days Can also add stool softener (colace 100mg ) 1 cap twice per day Goal is to have a few, soft, larger volume stools to clean/clear out your colon   Ibuprofen 600mg  q6-8 hrs (3x/day) with food Labs, chest xray, EKG today

## 2019-01-05 LAB — CBC
Hematocrit: 44.1 % (ref 37.5–51.0)
Hemoglobin: 15.7 g/dL (ref 13.0–17.7)
MCH: 31 pg (ref 26.6–33.0)
MCHC: 35.6 g/dL (ref 31.5–35.7)
MCV: 87 fL (ref 79–97)
Platelets: 246 10*3/uL (ref 150–450)
RBC: 5.06 x10E6/uL (ref 4.14–5.80)
RDW: 11.7 % (ref 11.6–15.4)
WBC: 7.5 10*3/uL (ref 3.4–10.8)

## 2019-01-05 LAB — SEDIMENTATION RATE: Sed Rate: 2 mm/hr (ref 0–15)

## 2019-01-05 LAB — C-REACTIVE PROTEIN: CRP: 2 mg/L (ref 0–10)

## 2019-01-15 ENCOUNTER — Other Ambulatory Visit: Payer: Self-pay | Admitting: Family Medicine

## 2019-01-15 DIAGNOSIS — F988 Other specified behavioral and emotional disorders with onset usually occurring in childhood and adolescence: Secondary | ICD-10-CM

## 2019-01-15 NOTE — Telephone Encounter (Signed)
Medication Refill - Medication:  amphetamine-dextroamphetamine (ADDERALL XR) 20 MG 24 hr capsule   Has the patient contacted their pharmacy? Yes advised to call office.   Preferred Pharmacy (with phone number or street name):  [facility] #[id number] - [place], [address] AT [place] [phone] (Phone) [phone] (Fax)   Agent: Please be advised that RX refills may take up to 3 business days. We ask that you follow-up with your pharmacy.

## 2019-01-15 NOTE — Telephone Encounter (Signed)
Requested medication (s) are due for refill today: yes  Requested medication (s) are on the active medication list: yes  Last refill:  10/18/2018  Future visit scheduled: no  Notes to clinic:  Refill cannot be delegated    Requested Prescriptions  Pending Prescriptions Disp Refills   amphetamine-dextroamphetamine (ADDERALL XR) 20 MG 24 hr capsule 30 capsule 0    Sig: Take 1 capsule (20 mg total) by mouth daily.     Not Delegated - Psychiatry:  Stimulants/ADHD Failed - 01/15/2019 12:26 PM      Failed - This refill cannot be delegated      Failed - Urine Drug Screen completed in last 360 days.      Passed - Valid encounter within last 3 months    Recent Outpatient Visits          1 week ago Left lower quadrant abdominal pain   [facility] [place], [place] K, DO   2 months ago Generalized anxiety disorder   [facility] [place], [place] K, [place]   5 months ago Anxiety   [facility] [place], [place] K, DO   7 months ago Generalized anxiety disorder   [facility] [place], [place] K, [place]   10 months ago Chronic pain of right knee   [facility] Rosemarie Ax, MD

## 2019-01-16 MED ORDER — AMPHETAMINE-DEXTROAMPHET ER 20 MG PO CP24: 20.0000 mg | ORAL_CAPSULE | Freq: Every day | ORAL | 0 refills | Status: DC

## 2019-02-06 ENCOUNTER — Other Ambulatory Visit: Payer: Self-pay

## 2019-02-07 ENCOUNTER — Ambulatory Visit: Payer: BC Managed Care – PPO | Admitting: Family Medicine

## 2019-02-07 ENCOUNTER — Encounter: Payer: Self-pay | Admitting: Family Medicine

## 2019-02-07 ENCOUNTER — Ambulatory Visit (INDEPENDENT_AMBULATORY_CARE_PROVIDER_SITE_OTHER): Payer: BC Managed Care – PPO

## 2019-02-07 VITALS — BP 130/82 | HR 55 | Temp 97.9°F | Ht 69.0 in | Wt 196.4 lb

## 2019-02-07 DIAGNOSIS — S86111S Strain of other muscle(s) and tendon(s) of posterior muscle group at lower leg level, right leg, sequela: Secondary | ICD-10-CM | POA: Diagnosis not present

## 2019-02-07 DIAGNOSIS — M25511 Pain in right shoulder: Secondary | ICD-10-CM

## 2019-02-07 DIAGNOSIS — S4991XA Unspecified injury of right shoulder and upper arm, initial encounter: Secondary | ICD-10-CM | POA: Diagnosis not present
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
## 2019-02-08 ENCOUNTER — Telehealth: Payer: Self-pay | Admitting: *Deleted

## 2019-02-08 NOTE — Telephone Encounter (Signed)
Reviewed shoulder xray results and physician's note with the patient. He is in agreement with the plan and will follow up if needed.

## 2019-02-13 ENCOUNTER — Ambulatory Visit: Payer: BC Managed Care – PPO | Admitting: Family Medicine

## 2019-02-13 NOTE — Progress Notes (Deleted)
NICKI GRACY - 28 y.o. male MRN [id number]  Date of birth: 1990-12-19  SUBJECTIVE:  Including CC & ROS.  No chief complaint on file.   DJON TITH is a 28 y.o. male that is  ***.  ***   Review of Systems  HISTORY: Past Medical, Surgical, Social, and Family History Reviewed & Updated per EMR.   Pertinent Historical Findings include:  Past Medical History:  Diagnosis Date  . Acne   . ADD (attention deficit disorder)     No past surgical history on file.  Allergies  Allergen Reactions  . Lexapro [Escitalopram Oxalate]     Suicidal thoughts  . Ceftin [Cefuroxime Axetil]     n/v  . Wellbutrin [Bupropion]     Anxiety, crying spells    Family History  Problem Relation Age of Onset  . COPD Father      Social History   Socioeconomic History  . Marital status: Single    Spouse name: Not on file  . Number of children: Not on file  . Years of education: Not on file  . Highest education level: Not on file  Occupational History  . Not on file  Tobacco Use  . Smoking status: Never Smoker  . Smokeless tobacco: Former Network engineer and Sexual Activity  . Alcohol use: Yes    Comment: social  . Drug use: No  . Sexual activity: Not on file  Other Topics Concern  . 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 Difficulty of Paying Living Expenses: Not on file  Food Insecurity:   . Worried About Charity fundraiser in the Last Year: Not on file  . Ran Out of Food in the Last Year: Not on file  Transportation Needs:   . Lack of Transportation (Medical): Not on file  . Lack of Transportation (Non-Medical): Not on file  Physical Activity:   . Days of Exercise per Week: Not on file  . Minutes of Exercise per Session: Not on file  Stress:   . Feeling of Stress : Not on file   Social Connections:   . Frequency of Communication with Friends and Family: Not on file  . Frequency of Social Gatherings with Friends and Family: Not on file  . Attends Religious Services: Not on file  . Active Member of Clubs or Organizations: Not on file  . Attends Archivist Meetings: Not on file  . Marital Status: Not on file  Intimate Partner Violence:   . Fear of Current or Ex-Partner: Not on file  . Emotionally Abused: Not on file  . Physically Abused: Not on file  . Sexually Abused: Not on file     PHYSICAL EXAM:  VS: There were no vitals taken for this visit. Physical Exam Gen: NAD, alert, cooperative with exam, well-appearing ENT: normal lips, normal nasal mucosa,  Eye: normal EOM, normal conjunctiva and lids CV:  no edema, +2 pedal pulses   Resp: no accessory muscle use, non-labored,  GI: no masses or tenderness, no hernia  Skin: no rashes, no areas of induration  Neuro: normal tone, normal sensation to touch Psych:  normal insight, alert and oriented MSK:  ***      ASSESSMENT & PLAN:   No problem-specific Assessment & Plan notes  found for this encounter.

## 2019-03-14 ENCOUNTER — Telehealth: Payer: Self-pay | Admitting: Family Medicine

## 2019-03-14 DIAGNOSIS — F988 Other specified behavioral and emotional disorders with onset usually occurring in childhood and adolescence: Secondary | ICD-10-CM

## 2019-03-14 MED ORDER — AMPHETAMINE-DEXTROAMPHET ER 20 MG PO CP24: 20.0000 mg | ORAL_CAPSULE | Freq: Every day | ORAL | 0 refills | Status: DC

## 2019-03-14 NOTE — Telephone Encounter (Signed)
Last OV 02/07/19 Last fill 01/16/19  #30/0 Pt requested for 90 days.

## 2019-03-14 NOTE — Telephone Encounter (Signed)
Only able to send 1 mo supply. Controlled substances do not allow for refills

## 2019-03-14 NOTE — Telephone Encounter (Signed)
Patient calling to get a refill for Adderall. Patient is requesting a 90 day supply be sent to pharmacy.

## 2019-03-30 ENCOUNTER — Telehealth: Payer: Self-pay | Admitting: Family Medicine

## 2019-03-30 NOTE — Telephone Encounter (Signed)
I spoke with pt and informed him that Dr. Barron Alvine sent in medication for 90 days w/1 refill in 12/2018.

## 2019-03-30 NOTE — Telephone Encounter (Signed)
Pt called and wanted to see if he can get his viibryd sent in for a 90 day supply instead 30 day supply

## 2019-04-18 ENCOUNTER — Telehealth: Payer: Self-pay | Admitting: Family Medicine

## 2019-04-18 DIAGNOSIS — F988 Other specified behavioral and emotional disorders with onset usually occurring in childhood and adolescence: Secondary | ICD-10-CM

## 2019-04-18 NOTE — Telephone Encounter (Signed)
Patient is calling and is requesting a refill for Adderall sent to [facility] on [address]. CB is [phone]

## 2019-04-20 MED ORDER — AMPHETAMINE-DEXTROAMPHET ER 20 MG PO CP24: 20.0000 mg | ORAL_CAPSULE | Freq: Every day | ORAL | 0 refills | Status: DC

## 2019-04-20 NOTE — Telephone Encounter (Signed)
Last OV 02/07/19 Last fill 03/14/19  #30/0

## 2019-04-20 NOTE — Telephone Encounter (Signed)
Rx refilled as requested. Pt needs appt in 1 mo for routine q60mo f/u

## 2019-04-20 NOTE — Telephone Encounter (Signed)
ldm

## 2019-04-25 ENCOUNTER — Ambulatory Visit: Payer: BC Managed Care – PPO | Admitting: Family Medicine

## 2019-05-24 ENCOUNTER — Telehealth: Payer: Self-pay | Admitting: Family Medicine

## 2019-05-24 DIAGNOSIS — F988 Other specified behavioral and emotional disorders with onset usually occurring in childhood and adolescence: Secondary | ICD-10-CM

## 2019-05-24 MED ORDER — AMPHETAMINE-DEXTROAMPHET ER 20 MG PO CP24: 20.0000 mg | ORAL_CAPSULE | Freq: Every day | ORAL | 0 refills | Status: DC

## 2019-05-24 NOTE — Telephone Encounter (Signed)
Last ov 02/07/19 Last fill 04/20/19  #30/0

## 2019-05-24 NOTE — Telephone Encounter (Signed)
Refilled x 1 mo but pt needs appt prior to next refill as he needs to be seen q24mo

## 2019-05-24 NOTE — Telephone Encounter (Signed)
Patient is calling and requesting a refill for Adderrall sent to [facility] on [address]. CB is [phone]

## 2019-05-25 NOTE — Telephone Encounter (Signed)
Sent Mychart message to pt informing him of scheduling a follow up visit before any other refills.

## 2019-06-26 ENCOUNTER — Telehealth: Payer: Self-pay | Admitting: Family Medicine

## 2019-06-26 NOTE — Telephone Encounter (Signed)
I spoke with pt and scheduled a follow up visit on Thursday virtually, as he needed to have a follow up q43months.

## 2019-06-26 NOTE — Telephone Encounter (Signed)
Patient is calling and requesting a refill for adderall sent to [facility] on [address]. CB is [phone]

## 2019-06-28 ENCOUNTER — Telehealth (INDEPENDENT_AMBULATORY_CARE_PROVIDER_SITE_OTHER): Payer: BC Managed Care – PPO | Admitting: Family Medicine

## 2019-06-28 ENCOUNTER — Encounter: Payer: Self-pay | Admitting: Family Medicine

## 2019-06-28 ENCOUNTER — Other Ambulatory Visit: Payer: Self-pay

## 2019-06-28 VITALS — Ht 69.0 in

## 2019-06-28 DIAGNOSIS — F988 Other specified behavioral and emotional disorders with onset usually occurring in childhood and adolescence: Secondary | ICD-10-CM | POA: Diagnosis not present

## 2019-06-28 DIAGNOSIS — F411 Generalized anxiety disorder: Secondary | ICD-10-CM | POA: Diagnosis not present

## 2019-06-28 MED ORDER — AMPHETAMINE-DEXTROAMPHET ER 20 MG PO CP24: 20.0000 mg | ORAL_CAPSULE | Freq: Every day | ORAL | 0 refills | Status: DC

## 2019-06-28 MED ORDER — VIIBRYD 20 MG PO TABS: 30.0000 mg | ORAL_TABLET | Freq: Every day | ORAL | 3 refills | Status: DC

## 2019-06-28 NOTE — Patient Instructions (Signed)
Health Maintenance Due  Topic Date Due  . HIV Screening  Never done    Depression screen [facility] 2/9 10/20/2018 06/14/2018 06/29/2016  Decreased Interest 0 0 0  Down, Depressed, Hopeless 0 0 0  PHQ - 2 Score 0 0 0  Altered sleeping 0 0 -  Tired, decreased energy 0 0 -  Change in appetite 0 0 -  Feeling bad or failure about yourself  0 0 -  Trouble concentrating 1 1 -  Moving slowly or fidgety/restless 0 0 -  Suicidal thoughts 0 0 -  PHQ-9 Score 1 1 -

## 2019-06-28 NOTE — Progress Notes (Signed)
Virtual Visit via Video Note  I connected with Gary Jennings on 06/28/19 at 11:00 AM EDT by a video enabled telemedicine application and verified that I am speaking with the correct person using two identifiers. Location patient: home Location provider: work or home office Persons participating in the virtual visit: patient, provider  I discussed the limitations of evaluation and management by telemedicine and the availability of in person appointments. The patient expressed understanding and agreed to proceed.  Chief Complaint  Patient presents with  . Follow-up    3 month follow up/medication refill     HPI: Gary Jennings is a 29 y.o. male seen for routine f/u on ADD and medication refill of adderall XR 20mg  daily. He is also due for refill of his viibryd which he takes for GAD.   Side effects - none Effective - yes Sleep - normal appetite - normal/good  Pt wonders about increasing dose of viibryd. He feels overall the med has been very effective for him but he notes feeling a bit more anxious, on edge in the past couple months. Overall he feels his mental health is really good but wants to see about higher dose.   Past Medical History:  Diagnosis Date  . Acne   . ADD (attention deficit disorder)     History reviewed. No pertinent surgical history.  Family History  Problem Relation Age of Onset  . COPD Father     Social History   Tobacco Use  . Smoking status: Never Smoker  . Smokeless tobacco: Former Network engineer Use Topics  . Alcohol use: Yes    Comment: social  . Drug use: No     Current Outpatient Medications:  .  amphetamine-dextroamphetamine (ADDERALL XR) 20 MG 24 hr capsule, Take 1 capsule (20 mg total) by mouth daily., Disp: 30 capsule, Rfl: 0 .  Vilazodone HCl (VIIBRYD) 20 MG TABS, Take 1 tablet (20 mg total) by mouth daily., Disp: 90 tablet, Rfl: 1  Allergies  Allergen Reactions  . Lexapro [Escitalopram Oxalate]     Suicidal thoughts  . Ceftin  [Cefuroxime Axetil]     n/v  . Wellbutrin [Bupropion]     Anxiety, crying spells      ROS: See pertinent positives and negatives per HPI.   EXAM:  VITALS per patient if applicable: Ht 5\' 9"  (1.753 m)   BMI 29.00 kg/m    GENERAL: alert, oriented, appears well and in no acute distress  NECK: normal movements of the head and neck  LUNGS: on inspection no signs of respiratory distress, breathing rate appears normal, no obvious gross SOB, gasping or wheezing, no conversational dyspnea  CV: no obvious cyanosis  PSYCH/NEURO: pleasant and cooperative, no obvious depression or anxiety, speech and thought processing grossly intact   ASSESSMENT AND PLAN: 1. Attention deficit disorder (ADD) without hyperactivity - stable, controlled - database reviewed and appropriate - needs UDS and controlled substance agreement at next OV Refill: - amphetamine-dextroamphetamine (ADDERALL XR) 20 MG 24 hr capsule; Take 1 capsule (20 mg total) by mouth daily.  Dispense: 30 capsule; Refill: 0 - f/u in 3 mo or sooner PRN  2. Generalized anxiety disorder - overall stable but pt notes some increased anxiety in past couple of months Increase: - Vilazodone HCl (VIIBRYD) 20 MG TABS; Take 1.5 tablets (30 mg total) by mouth daily.  Dispense: 135 tablet; Refill: 3 - increased from 20mg  to 30mg  daily - f/u in 3-4 wks or sooner PRN  Discussed plan and  reviewed medications with patient, including risks, benefits, and potential side effects. Pt expressed understand. All questions answered.   I discussed the assessment and treatment plan with the patient. The patient was provided an opportunity to ask questions and all were answered. The patient agreed with the plan and demonstrated an understanding of the instructions.   The patient was advised to call back or seek an in-person evaluation if the symptoms worsen or if the condition fails to improve as anticipated.   Luana Shu, DO

## 2019-07-12 ENCOUNTER — Other Ambulatory Visit: Payer: Self-pay | Admitting: Family Medicine

## 2019-07-12 DIAGNOSIS — R0781 Pleurodynia: Secondary | ICD-10-CM

## 2019-08-14 ENCOUNTER — Telehealth: Payer: Self-pay | Admitting: Family Medicine

## 2019-08-14 DIAGNOSIS — F988 Other specified behavioral and emotional disorders with onset usually occurring in childhood and adolescence: Secondary | ICD-10-CM

## 2019-08-14 NOTE — Telephone Encounter (Signed)
Patient called for refill of Adderall. Please send to same [facility] pharmacy.

## 2019-08-15 MED ORDER — AMPHETAMINE-DEXTROAMPHET ER 20 MG PO CP24: 20.0000 mg | ORAL_CAPSULE | Freq: Every day | ORAL | 0 refills | Status: DC

## 2019-08-15 NOTE — Telephone Encounter (Signed)
Last OV 06/28/19 Last fill 06/28/19  #30/0

## 2019-08-15 NOTE — Telephone Encounter (Signed)
Rx sent 

## 2019-10-05 ENCOUNTER — Telehealth: Payer: Self-pay | Admitting: Family Medicine

## 2019-10-05 NOTE — Telephone Encounter (Signed)
Patient needs refills of Adderall and 90 days supply Viibryd Please call to new pharmacy: [facility] [address], [place], [place], phone [phone].

## 2019-10-06 ENCOUNTER — Other Ambulatory Visit: Payer: Self-pay

## 2019-10-06 DIAGNOSIS — F988 Other specified behavioral and emotional disorders with onset usually occurring in childhood and adolescence: Secondary | ICD-10-CM

## 2019-10-06 DIAGNOSIS — F411 Generalized anxiety disorder: Secondary | ICD-10-CM

## 2019-10-06 MED ORDER — VIIBRYD 20 MG PO TABS: 30.0000 mg | ORAL_TABLET | Freq: Every day | ORAL | 0 refills | Status: DC

## 2019-10-06 MED ORDER — AMPHETAMINE-DEXTROAMPHET ER 20 MG PO CP24: 20.0000 mg | ORAL_CAPSULE | Freq: Every day | ORAL | 0 refills | Status: DC

## 2019-10-06 NOTE — Telephone Encounter (Signed)
Rx request sent to Doc of the day.

## 2019-10-06 NOTE — Telephone Encounter (Signed)
Last VV 06/28/19 Last fill for Add 08/15/19  #30/0 Last fill for Viib  06/28/19  #135/3

## 2019-10-15 ENCOUNTER — Other Ambulatory Visit: Payer: Self-pay | Admitting: Family Medicine

## 2019-10-15 DIAGNOSIS — F411 Generalized anxiety disorder: Secondary | ICD-10-CM

## 2019-10-15 NOTE — Telephone Encounter (Signed)
Gary Jennings please advise as doc of the day.  Pt is calling wanting a 90 day supply for Viibryd, pt states the one sent on 10/06/2019 went to [facility] because he was on vacation but his insurance wouldn't cover it down there and so he paid out of pocket for 6 pills and now is completely out. Pt asking for a 90 day supply because insurance pays more for the 90 supply.  Viibryd 45 tabs  0-refill Last filled-10/06/2019(just got out of pocket 6 pills to last him to Sunday while on vacation)  Last OV-06/28/2019//pt scheduled for 10/17/2019

## 2019-10-15 NOTE — Telephone Encounter (Signed)
Per pharmacy in [facility], he filled this Rx 10/11/2019 and 6tabs were dispensed. With his upcoming appt on 10/17/2019, I will let Dr. Salena Saner address this rx on that day

## 2019-10-15 NOTE — Telephone Encounter (Signed)
Patient is calling and requesting a refill for Viibyrd 90 day supply sent to [facility] on [address] in [place]. Informed patient that Dr. Barron Alvine is out of the office and request would be sent to the provider of the day, please advise. CB is [phone]

## 2019-10-17 ENCOUNTER — Telehealth (INDEPENDENT_AMBULATORY_CARE_PROVIDER_SITE_OTHER): Payer: BC Managed Care – PPO | Admitting: Family Medicine

## 2019-10-17 ENCOUNTER — Encounter: Payer: Self-pay | Admitting: Family Medicine

## 2019-10-17 ENCOUNTER — Other Ambulatory Visit: Payer: Self-pay

## 2019-10-17 DIAGNOSIS — F988 Other specified behavioral and emotional disorders with onset usually occurring in childhood and adolescence: Secondary | ICD-10-CM

## 2019-10-17 DIAGNOSIS — F411 Generalized anxiety disorder: Secondary | ICD-10-CM

## 2019-10-17 MED ORDER — AMPHETAMINE-DEXTROAMPHETAMINE 10 MG PO TABS: 10.0000 mg | ORAL_TABLET | Freq: Every day | ORAL | 0 refills | Status: DC | PRN

## 2019-10-17 MED ORDER — VIIBRYD 20 MG PO TABS: 30.0000 mg | ORAL_TABLET | Freq: Every day | ORAL | 3 refills | Status: DC

## 2019-10-17 MED ORDER — AMPHETAMINE-DEXTROAMPHET ER 20 MG PO CP24: 20.0000 mg | ORAL_CAPSULE | Freq: Every day | ORAL | 0 refills | Status: DC

## 2019-10-17 NOTE — Progress Notes (Signed)
Virtual Visit via Video Note  I connected with Gary Jennings on 10/17/19 at 11:30 AM EDT by a video enabled telemedicine application and verified that I am speaking with the correct person using two identifiers. Location patient: car/work Location provider: work Persons participating in the virtual visit: patient, provider  I discussed the limitations of evaluation and management by telemedicine and the availability of in person appointments. The patient expressed understanding and agreed to proceed.  Chief Complaint  Patient presents with  . Follow-up    med refill-Adderrall-    HPI: Gary Jennings is a 29 y.o. male seen today for routine f/u on ADD, anxiety and medication refills.  Pt feels around 2-2:30pm meds wears off. He has been getting up earlier for work and taking med earlier in the AM.  Appetite is good, sleep is good.  Denies headache, dizziness, vision changes, CP, SOB, numbness or paresthesias. He needs a refill of his viibryd but insurance requires 90 day supply so pt is requesting it be sent to pharm as such.   Past Medical History:  Diagnosis Date  . Acne   . ADD (attention deficit disorder)     History reviewed. No pertinent surgical history.  Family History  Problem Relation Age of Onset  . COPD Father     Social History   Tobacco Use  . Smoking status: Never Smoker  . Smokeless tobacco: Former Clinical biochemist  . Vaping Use: Never used  Substance Use Topics  . Alcohol use: Yes    Comment: social  . Drug use: No     Current Outpatient Medications:  .  amphetamine-dextroamphetamine (ADDERALL XR) 20 MG 24 hr capsule, Take 1 capsule (20 mg total) by mouth daily., Disp: 15 capsule, Rfl: 0 .  Vilazodone HCl (VIIBRYD) 20 MG TABS, Take 1.5 tablets (30 mg total) by mouth daily., Disp: 45 tablet, Rfl: 0  Allergies  Allergen Reactions  . Lexapro [Escitalopram Oxalate]     Suicidal thoughts  . Ceftin [Cefuroxime Axetil]     n/v  . Wellbutrin  [Bupropion]     Anxiety, crying spells      ROS: See pertinent positives and negatives per HPI.   EXAM:  VITALS per patient if applicable: Wt 190 lb (86.2 kg)   BMI 28.06 kg/m    GENERAL: alert, oriented, appears well and in no acute distress  HEENT: atraumatic, conjunctiva clear, no obvious abnormalities on inspection of external nose and ears  NECK: normal movements of the head and neck  LUNGS: on inspection no signs of respiratory distress, breathing rate appears normal, no obvious gross SOB, gasping or wheezing, no conversational dyspnea  CV: no obvious cyanosis  PSYCH/NEURO: pleasant and cooperative, no obvious depression or anxiety, speech and thought processing grossly intact   ASSESSMENT AND PLAN: 1. Generalized anxiety disorder - stable, controlled Refill: - Vilazodone HCl (VIIBRYD) 20 MG TABS; Take 1.5 tablets (30 mg total) by mouth daily.  Dispense: 135 tablet; Refill: 3  2. Attention deficit disorder (ADD) without hyperactivity - not as well-controlled as previously was; pt feels med is wearing off and he notes being more easily distracted and less focused by mid-afternoon Refill: - amphetamine-dextroamphetamine (ADDERALL XR) 20 MG 24 hr capsule; Take 1 capsule (20 mg total) by mouth daily.  Dispense: 30 capsule; Refill: 0 Rx: - amphetamine-dextroamphetamine (ADDERALL) 10 MG tablet; Take 1 tablet (10 mg total) by mouth daily as needed. Take along with 20mg  XR  Dispense: 30 tablet; Refill: 0 - pt  will not take on weekend or when not working - database reviewed and appropriate - needs UDS at next OV in 3 mo - f/u in 3 mo or sooner PRN Discussed plan and reviewed medications with patient, including risks, benefits, and potential side effects. Pt expressed understand. All questions answered.   I discussed the assessment and treatment plan with the patient. The patient was provided an opportunity to ask questions and all were answered. The patient agreed with the  plan and demonstrated an understanding of the instructions.   The patient was advised to call back or seek an in-person evaluation if the symptoms worsen or if the condition fails to improve as anticipated.   Luana Shu, DO

## 2019-11-20 ENCOUNTER — Telehealth: Payer: Self-pay | Admitting: Family Medicine

## 2019-11-20 DIAGNOSIS — F988 Other specified behavioral and emotional disorders with onset usually occurring in childhood and adolescence: Secondary | ICD-10-CM

## 2019-11-20 NOTE — Telephone Encounter (Signed)
Dr C please advise. Pt is asking for a refill for:  Adderall 20mg  30 caps   0-refills Last fill-10/17/2019 Last OV-10/17/2019 Pmp-no supervisor  Adderall 10mg   30-tabs   0-refill Last Fill-10/17/2019 Last OV-10/17/2019 PmP-no supervisor

## 2019-11-20 NOTE — Telephone Encounter (Signed)
Patient would like refills of both of his Adderall prescriptions. Please send to same [facility] on [address]

## 2019-11-21 MED ORDER — AMPHETAMINE-DEXTROAMPHETAMINE 10 MG PO TABS: 10.0000 mg | ORAL_TABLET | Freq: Every day | ORAL | 0 refills | Status: DC | PRN

## 2019-11-21 MED ORDER — AMPHETAMINE-DEXTROAMPHET ER 20 MG PO CP24: 20.0000 mg | ORAL_CAPSULE | Freq: Every day | ORAL | 0 refills | Status: DC

## 2019-11-21 NOTE — Telephone Encounter (Signed)
Pt was notified and verbally understood to pic up his Rxs.

## 2019-11-21 NOTE — Telephone Encounter (Signed)
Both Rx refilled 

## 2020-01-18 ENCOUNTER — Telehealth: Payer: Self-pay | Admitting: Family Medicine

## 2020-01-18 DIAGNOSIS — F988 Other specified behavioral and emotional disorders with onset usually occurring in childhood and adolescence: Secondary | ICD-10-CM

## 2020-01-18 MED ORDER — AMPHETAMINE-DEXTROAMPHETAMINE 10 MG PO TABS: 10.0000 mg | ORAL_TABLET | Freq: Every day | ORAL | 0 refills | Status: DC | PRN

## 2020-01-18 MED ORDER — AMPHETAMINE-DEXTROAMPHET ER 20 MG PO CP24: 20.0000 mg | ORAL_CAPSULE | Freq: Every day | ORAL | 0 refills | Status: DC

## 2020-01-18 NOTE — Telephone Encounter (Signed)
Refill request for: Adderall 20 mg & 10 mg LR 11/21/19, #30, 0 rf LOV 10/17/19 FOV  None scheduled   Please review and advise.   Thanks.  Dm/cma

## 2020-01-18 NOTE — Telephone Encounter (Signed)
Patient is calling to get a refill on both of his Aderrall's. If approved, please send to [facility] on [facility] and call him at [phone] to let him know that it has been sent in.

## 2020-01-18 NOTE — Telephone Encounter (Signed)
meds refilled to requested pharm. Pt needs 3 mo f/u appt prior to next refill

## 2020-01-18 NOTE — Telephone Encounter (Signed)
Spoke to patient and notified him that RX was sent to the pharmacy and that he needed to have a f/u visit before next fill.  He stated that he would call back to schedule.  Dm/cma

## 2020-02-01 ENCOUNTER — Telehealth: Payer: Self-pay

## 2020-02-01 DIAGNOSIS — F411 Generalized anxiety disorder: Secondary | ICD-10-CM

## 2020-02-01 MED ORDER — VIIBRYD 20 MG PO TABS: 30.0000 mg | ORAL_TABLET | Freq: Every day | ORAL | 3 refills | Status: DC

## 2020-02-01 NOTE — Telephone Encounter (Signed)
Refill sent.

## 2020-02-01 NOTE — Telephone Encounter (Signed)
Pt calling for a refill of the Vibryd and to also change pharmacy to [facility], [facility]. Last VV 10/17/19 Last fill 10/17/19  #135/3 Please advise.

## 2020-02-07 NOTE — Telephone Encounter (Signed)
Using available formulary data, we believe that the following medications may be covered. Drug Name:Trintellix Tier 3 PA Requirement                                       NOT Required*

## 2020-02-18 ENCOUNTER — Telehealth: Payer: Self-pay | Admitting: Family Medicine

## 2020-02-18 NOTE — Telephone Encounter (Signed)
Patient is calling because the pharmacy will not fill his Vybrid because of his insurance [facility]) and he is completely out of his medication. He said that the pharmacy will only approve 1 pill daily and he takes 1 1/2 pills. Please call patient at [phone] if you have any questions.  Insurance ID# [id number] Insurance phone # [phone]

## 2020-02-19 ENCOUNTER — Telehealth: Payer: Self-pay

## 2020-02-19 DIAGNOSIS — F411 Generalized anxiety disorder: Secondary | ICD-10-CM

## 2020-02-19 MED ORDER — VIIBRYD 20 MG PO TABS: 20.0000 mg | ORAL_TABLET | Freq: Every day | ORAL | 3 refills | Status: DC

## 2020-02-19 NOTE — Telephone Encounter (Signed)
Patient called and stated that his insuracne will not pay for his rx for Viibryd 20mg , 1.5tab, QD. Patient explains that they will only pay for 1 tab and need to know way he is taking 1.5.  Pt said that the pharmacy informed him that his PCP can send in medication as, #30, 1 tab, QD, and insurance will pay for that.  I informed pt that Dr. is out of office today but will be here tomorrow, pt wanted to know if I could send it to a provider here in office today.  I told him that I would send message to both providers.  Please advise.

## 2020-02-20 NOTE — Telephone Encounter (Signed)
Patient notified and verbalized understanding PA has been sent in also.

## 2020-02-25 ENCOUNTER — Telehealth: Payer: Self-pay

## 2020-02-25 DIAGNOSIS — F411 Generalized anxiety disorder: Secondary | ICD-10-CM

## 2020-02-25 NOTE — Telephone Encounter (Signed)
Antwuan calling from [employer] to inform office that he will send over a PA form to be signed and fax back.  Fax# [phone]

## 2020-02-26 NOTE — Telephone Encounter (Signed)
Form filled out and awaiting signature form provider.   Placed on [employer].  Dm/cma

## 2020-02-26 NOTE — Telephone Encounter (Signed)
Last RX Dr Barron Alvine sent in for patent was for 1.5 tabs daily. The one you sent was for 1 daily for insurance coverage purposes.  The PA form is on your desk.  Dm/cma

## 2020-02-27 MED ORDER — VIIBRYD 20 MG PO TABS: 30.0000 mg | ORAL_TABLET | Freq: Every day | ORAL | 3 refills | Status: DC

## 2020-02-27 NOTE — Telephone Encounter (Signed)
Done

## 2020-03-05 NOTE — Telephone Encounter (Signed)
PA for Viibryd 20 mg approved from 02/28/20- 02/27/21 for qty 45  For a 30 day supply.  Dm/cma

## 2020-03-06 ENCOUNTER — Encounter: Payer: Self-pay | Admitting: Family Medicine

## 2020-03-06 ENCOUNTER — Telehealth (INDEPENDENT_AMBULATORY_CARE_PROVIDER_SITE_OTHER): Payer: BC Managed Care – PPO | Admitting: Family Medicine

## 2020-03-06 VITALS — Wt 205.0 lb

## 2020-03-06 DIAGNOSIS — F988 Other specified behavioral and emotional disorders with onset usually occurring in childhood and adolescence: Secondary | ICD-10-CM

## 2020-03-06 DIAGNOSIS — F411 Generalized anxiety disorder: Secondary | ICD-10-CM

## 2020-03-06 MED ORDER — AMPHETAMINE-DEXTROAMPHET ER 20 MG PO CP24: 20.0000 mg | ORAL_CAPSULE | Freq: Every day | ORAL | 0 refills | Status: DC

## 2020-03-06 MED ORDER — AMPHETAMINE-DEXTROAMPHETAMINE 10 MG PO TABS: 10.0000 mg | ORAL_TABLET | Freq: Every day | ORAL | 0 refills | Status: DC | PRN

## 2020-03-06 NOTE — Progress Notes (Signed)
Virtual Visit via Video Note  I connected with Gary Jennings on 03/06/20 at  3:30 PM EST by a video enabled telemedicine application and verified that I am speaking with the correct person using two identifiers. Location patient: home Location provider: work or home office Persons participating in the virtual visit: patient, provider  I discussed the limitations of evaluation and management by telemedicine and the availability of in person appointments. The patient expressed understanding and agreed to proceed.  Chief Complaint  Patient presents with  . Follow-up    F/u meds. No concerns.      HPI: Gary Jennings is a 30 y.o. male seen today for routine f/u on ADD and anxiety. For his ADD, pt takes Adderall XR 20mg  daily and then adderal 10mg  daily PRN. For anxiety he takes vybrid 30mg  daily. PA was recently done to get approval for 20mg  tabs with sig of 1.5 tabs daily.  Pt is doing well, no acute issues or concerns. Sleep and appetite are good. No med side effects noted. Symptoms controlled.    Past Medical History:  Diagnosis Date  . Acne   . ADD (attention deficit disorder)     History reviewed. No pertinent surgical history.  Family History  Problem Relation Age of Onset  . COPD Father     Social History   Tobacco Use  . Smoking status: Never Smoker  . Smokeless tobacco: Former  . Vaping Use: Never used  Substance Use Topics  . Alcohol use: Yes    Comment: social  . Drug use: No     Current Outpatient Medications:  .  amphetamine-dextroamphetamine (ADDERALL XR) 20 MG 24 hr capsule, Take 1 capsule (20 mg total) by mouth daily., Disp: 30 capsule, Rfl: 0 .  amphetamine-dextroamphetamine (ADDERALL) 10 MG tablet, Take 1 tablet (10 mg total) by mouth daily as needed. Take along with 20mg  XR, Disp: 30 tablet, Rfl: 0 .  Vilazodone HCl (VIIBRYD) 20 MG TABS, Take 1.5 tablets (30 mg total) by mouth daily., Disp: 135 tablet, Rfl: 3  Allergies   Allergen Reactions  . Lexapro [Escitalopram Oxalate]     Suicidal thoughts  . Ceftin [Cefuroxime Axetil]     n/v  . Wellbutrin [Bupropion]     Anxiety, crying spells      ROS: See pertinent positives and negatives per HPI.   EXAM:  VITALS per patient if applicable: Wt 205 lb (93 kg) Comment: pt reported  BMI 30.27 kg/m    GENERAL: alert, oriented, appears well and in no acute distress  HEENT: atraumatic, conjunctiva clear, no obvious abnormalities on inspection of external nose and ears  NECK: normal movements of the head and neck  LUNGS: on inspection no signs of respiratory distress, breathing rate appears normal, no obvious gross SOB, gasping or wheezing, no conversational dyspnea  CV: no obvious cyanosis  PSYCH/NEURO: pleasant and cooperative, no obvious depression or anxiety, speech and thought processing grossly intact   ASSESSMENT AND PLAN:  1. Attention deficit disorder (ADD) without hyperactivity - controlled, at baseline - database reviewed and appropriate - needs UTS at next OV in 3 mo - amphetamine-dextroamphetamine (ADDERALL XR) 20 MG 24 hr capsule; Take 1 capsule (20 mg total) by mouth daily.  Dispense: 30 capsule; Refill: 0 - amphetamine-dextroamphetamine (ADDERALL) 10 MG tablet; Take 1 tablet (10 mg total) by mouth daily as needed. Take along with 20mg  XR  Dispense: 30 tablet; Refill: 0 - f/u in 3 mo - in-person OV  2. Generalized anxiety disorder - controlled, at baseline - cont vybrid 30mg  daily - PA done and approved thru 01/2021   I discussed the assessment and treatment plan with the patient. The patient was provided an opportunity to ask questions and all were answered. The patient agreed with the plan and demonstrated an understanding of the instructions.   The patient was advised to call back or seek an in-person evaluation if the symptoms worsen or if the condition fails to improve as anticipated.   03/2021, DO

## 2020-04-08 ENCOUNTER — Telehealth: Payer: Self-pay

## 2020-04-08 NOTE — Telephone Encounter (Signed)
PA form for the Adderall 10 mg and 20 mg tabs received/filled out/faxed to [employer] @ [phone]. waiting on response. Dm/cma

## 2020-06-17 ENCOUNTER — Telehealth: Payer: Self-pay | Admitting: Family Medicine

## 2020-06-17 DIAGNOSIS — F988 Other specified behavioral and emotional disorders with onset usually occurring in childhood and adolescence: Secondary | ICD-10-CM

## 2020-06-17 NOTE — Telephone Encounter (Signed)
Last VV 03/06/20 Last fill 03/06/20 #30/0

## 2020-06-17 NOTE — Telephone Encounter (Signed)
What is the name of the medication? amphetamine-dextroamphetamine (ADDERALL XR) 20 MG 24 hr capsule [[id number]]   Have you contacted your pharmacy to request a refill? Pt needs a refill.  Which pharmacy would you like this sent to? Pharmacy  [facility] [address], [place] - [address] [facility]  [address] [place], [place] [address]  Phone:  [phone] Fax:  [phone]       Patient notified that their request is being sent to the clinical staff for review and that they should receive a call once it is complete. If they do not receive a call within 72 hours they can check with their pharmacy or our office.

## 2020-06-17 NOTE — Telephone Encounter (Signed)
Sent to provider 

## 2020-06-18 MED ORDER — AMPHETAMINE-DEXTROAMPHET ER 20 MG PO CP24: 20.0000 mg | ORAL_CAPSULE | Freq: Every day | ORAL | 0 refills | Status: DC

## 2020-06-19 MED ORDER — AMPHETAMINE-DEXTROAMPHET ER 20 MG PO CP24: 20.0000 mg | ORAL_CAPSULE | Freq: Every day | ORAL | 0 refills | Status: DC

## 2020-06-19 NOTE — Telephone Encounter (Signed)
Rx sent to [facility]

## 2020-06-19 NOTE — Telephone Encounter (Signed)
Can this be resent to [facility], [place]?

## 2020-06-19 NOTE — Addendum Note (Signed)
Addended by: Overton Mam on: 06/19/2020 02:15 PM   Modules accepted: Orders

## 2020-06-19 NOTE — Telephone Encounter (Signed)
Patient is calling because his Adderall 20mg  was sent to [facility] instead of [facility]. Please resend and call patient to let him know.

## 2020-06-23 ENCOUNTER — Other Ambulatory Visit: Payer: Self-pay

## 2020-06-23 ENCOUNTER — Ambulatory Visit: Payer: Self-pay

## 2020-06-23 ENCOUNTER — Ambulatory Visit (INDEPENDENT_AMBULATORY_CARE_PROVIDER_SITE_OTHER): Payer: No Typology Code available for payment source | Admitting: Family Medicine

## 2020-06-23 ENCOUNTER — Ambulatory Visit (INDEPENDENT_AMBULATORY_CARE_PROVIDER_SITE_OTHER): Payer: No Typology Code available for payment source

## 2020-06-23 VITALS — BP 118/80 | HR 57 | Ht 69.0 in | Wt 207.6 lb

## 2020-06-23 DIAGNOSIS — M7652 Patellar tendinitis, left knee: Secondary | ICD-10-CM

## 2020-06-23 DIAGNOSIS — G8929 Other chronic pain: Secondary | ICD-10-CM

## 2020-06-23 DIAGNOSIS — M25561 Pain in right knee: Secondary | ICD-10-CM | POA: Diagnosis not present

## 2020-06-23 DIAGNOSIS — M25562 Pain in left knee: Secondary | ICD-10-CM | POA: Diagnosis not present

## 2020-06-23 DIAGNOSIS — M2341 Loose body in knee, right knee: Secondary | ICD-10-CM

## 2020-06-23 MED ORDER — NITROGLYCERIN 0.2 MG/HR TD PT24: MEDICATED_PATCH | TRANSDERMAL | 1 refills | Status: DC

## 2020-06-23 NOTE — Progress Notes (Signed)
I, Gary Jennings, LAT, ATC acting as a scribe for Gary Graham, MD.  Subjective:    CC: Bilateral knee and calf pain  HPI: Pt is a 30 y/o male c/o bilat knee pain ongoing for a few years w/ worsening pain lately.  Right knee:: Patient reports mechanical locking and catching and a loose body sensation in his right knee.  This is been ongoing for years but worsened significantly over the last few months.  He notes about 8 times a day he feels a loose body sensation in his knee and significant locking sensation occurs several times per week.  This is very bothersome.  However most the time the knee does not hurt.  Left knee: Patient has persistent over the last several months pain slightly worsening the anterior aspect the knee.  This is worse with activity better with rest.  Has not tried much treatment for this issue yet.  Swelling: no Mechanical symptoms: Yes Aggravates: being active Treatments tried: none  Dx imaging: 02/14/08 Bilat knee XR  Pertinent review of Systems: No fevers or chills  Relevant historical information: GERD   Objective:    Vitals:   06/23/20 1533  BP: 118/80  Pulse: (!) 57  SpO2: 98%   General: Well Developed, well nourished, and in no acute distress.   MSK: Right knee normal-appearing normal motion Nontender. Stable ligamentous exam. Negative McMurray's test.  Left knee normal-appearing Normal motion. Palpable squeak overlying proximal patella tendon. Tender palpation overlying proximal patellar tendon. Intact strength never some pain with resisted knee extension. Stable ligamentous exam.   Lab and Radiology Results  Diagnostic Limited MSK Ultrasound of: Left knee Quad tendon intact normal-appearing No significant joint effusion. Patellar tendon is intact.  However small amount of hypoechoic change present within mid substance tendon proximal aspect of tendon consistent with patellar tendinitis. Normal medial and lateral joint  line. No Baker's cyst. Impression: Patellar tendinitis . X-ray images bilateral knees obtained today personally and independently interpreted  Right knee: Large calcific loose body present superior to patella.  No acute fractures.  Left knee: Minimal degenerative changes.  No acute fractures.  Await formal radiology review     Impression and Recommendations:    Assessment and Plan: 30 y.o. male with left knee pain due to patellar tendinitis.  Treatable with home exercise program as taught in clinic today by ATC and nitroglycerin patch protocol.  If not resolving sufficiently on its own can refer to PT.  Patient will keep me updated.  Significant mechanical symptoms right knee concerning for loose body.  Loose body clearly seen on x-ray however radiology review is still pending.  Will obtain MRI to further characterize loose body and for potential surgical planning.  Patient is having enough significantly bothersome mechanical symptoms now that he would like to proceed with surgery on this.  PDMP not reviewed this encounter. Orders Placed This Encounter  Procedures  . [place] LIMITED JOINT SPACE STRUCTURES LOW BILAT(NO LINKED CHARGES)    Standing Status:   Future    Number of Occurrences:   1    Standing Expiration Date:   12/23/2020    Order Specific Question:   Reason for Exam (SYMPTOM  OR DIAGNOSIS REQUIRED)    Answer:   chronic bilateral knee pain    Order Specific Question:   Preferred imaging location?    Answer:   [facility]ult [facility]  . MR Knee Right Wo Contrast    Standing Status:   Future    Standing  Expiration Date:   06/23/2021    Order Specific Question:   What is the patient's sedation requirement?    Answer:   No Sedation    Order Specific Question:   Does the patient have a pacemaker or implanted devices?    Answer:   No    Order Specific Question:   Preferred imaging location?    Answer:   Licensed conveyancer (table limit-350lbs)  . DG Knee  AP/LAT W/Sunrise Right    Standing Status:   Future    Number of Occurrences:   1    Standing Expiration Date:   06/23/2021    Order Specific Question:   Reason for Exam (SYMPTOM  OR DIAGNOSIS REQUIRED)    Answer:   eval loose body    Order Specific Question:   Preferred imaging location?    Answer:   [facility]  . DG Knee AP/LAT W/Sunrise Left    Standing Status:   Future    Number of Occurrences:   1    Standing Expiration Date:   06/23/2021    Order Specific Question:   Reason for Exam (SYMPTOM  OR DIAGNOSIS REQUIRED)    Answer:   patellar tendonitis    Order Specific Question:   Preferred imaging location?    Answer:   [facility]   Meds ordered this encounter  Medications  . nitroGLYCERIN (NITRODUR - DOSED IN MG/24 HR) 0.2 mg/hr patch    Sig: Apply 1/4 patch daily to tendon for tendonitis.    Dispense:  30 patch    Refill:  1    Discussed warning signs or symptoms. Please see discharge instructions. Patient expresses understanding.   The above documentation has been reviewed and is accurate and complete Gary Jennings, M.D.

## 2020-06-23 NOTE — Patient Instructions (Addendum)
Thank you for coming in today.  Please get an Xray today before you leave  Recheck after MRI  Please complete the exercises that the athletic trainer went over with you: View at [email or web address] using code: [id number]  You should hear from MRI scheduling within 1 week. If you do not hear please let me know.   Nitroglycerin Protocol   Apply 1/4 nitroglycerin patch to affected area daily.  Change position of patch within the affected area every 24 hours.  You may experience a headache during the first 1-2 weeks of using the patch, these should subside.  If you experience headaches after beginning nitroglycerin patch treatment, you may take your preferred over the counter pain reliever.  Another side effect of the nitroglycerin patch is skin irritation or rash related to patch adhesive.  Please notify our office if you develop more severe headaches or rash, and stop the patch.  Tendon healing with nitroglycerin patch may require 12 to 24 weeks depending on the extent of injury.  Men should not use if taking Viagra, Cialis, or Levitra.   Do not use if you have migraines or rosacea.

## 2020-06-24 NOTE — Progress Notes (Signed)
X-ray left knee looks normal to radiology

## 2020-06-24 NOTE — Progress Notes (Signed)
X-ray right knee shows no fractures but does show a full sole of calcification in the pouch at the top of the knee measuring 2 cm by almost 1 cm.  This could represent a loose body that is causing your problem.  Plan for MRI to further characterize this.

## 2020-07-15 ENCOUNTER — Telehealth: Payer: Self-pay | Admitting: Family Medicine

## 2020-07-15 DIAGNOSIS — F988 Other specified behavioral and emotional disorders with onset usually occurring in childhood and adolescence: Secondary | ICD-10-CM

## 2020-07-15 NOTE — Telephone Encounter (Signed)
Patient is requesting a refill on both of his Adderall medications. If approved, please send to Karin Golden at [facility] and call him to let him know that they have been sent in.

## 2020-07-15 NOTE — Telephone Encounter (Signed)
Last VV 03/06/20 Last fill for 20mg  06/19/20 #30/0 Last fill for 10mg  03/06/20  #30/0

## 2020-07-17 MED ORDER — AMPHETAMINE-DEXTROAMPHET ER 20 MG PO CP24: 20.0000 mg | ORAL_CAPSULE | Freq: Every day | ORAL | 0 refills | Status: DC

## 2020-07-17 MED ORDER — AMPHETAMINE-DEXTROAMPHETAMINE 10 MG PO TABS: 10.0000 mg | ORAL_TABLET | Freq: Every day | ORAL | 0 refills | Status: DC | PRN

## 2020-07-17 NOTE — Telephone Encounter (Signed)
Refills sent

## 2020-08-20 ENCOUNTER — Telehealth: Payer: Self-pay | Admitting: Family Medicine

## 2020-08-20 DIAGNOSIS — F988 Other specified behavioral and emotional disorders with onset usually occurring in childhood and adolescence: Secondary | ICD-10-CM

## 2020-08-20 NOTE — Telephone Encounter (Signed)
Last VV 03/06/20 Last fill 07/17/20  #30/0

## 2020-08-20 NOTE — Telephone Encounter (Signed)
Patient is calling to get a refill on both of his Adderall's. If approved, please send to [facility] Golden and call patient to let him know that it has been sent in. Patient is aware that provider is out of the office.

## 2020-08-20 NOTE — Telephone Encounter (Signed)
Pt informed via mychart message.

## 2020-08-22 MED ORDER — AMPHETAMINE-DEXTROAMPHET ER 20 MG PO CP24: 20.0000 mg | ORAL_CAPSULE | Freq: Every day | ORAL | 0 refills | Status: DC

## 2020-08-22 NOTE — Addendum Note (Signed)
Addended by: Andrez Grime on: 08/22/2020 04:41 PM   Modules accepted: Orders

## 2020-08-22 NOTE — Telephone Encounter (Signed)
Pt called and scheduled appt 7/20 with Dr. Barron Alvine. She is currently out of office. He will be out of town the following 2 weeks. This is the first available for both schedules. Pt ran out of medication a couple days ago. Requesting refill on both strengths until his appt.  [id number] - [place], [place] - [address] Phone:  [phone]  Fax:  [phone]     Can doc of the day approve?

## 2020-09-17 ENCOUNTER — Ambulatory Visit: Payer: No Typology Code available for payment source | Admitting: Family Medicine

## 2020-09-17 ENCOUNTER — Other Ambulatory Visit: Payer: Self-pay

## 2020-09-17 ENCOUNTER — Encounter: Payer: Self-pay | Admitting: Family Medicine

## 2020-09-17 VITALS — BP 130/84 | HR 55 | Temp 97.9°F | Ht 68.0 in | Wt 209.8 lb

## 2020-09-17 DIAGNOSIS — Z79899 Other long term (current) drug therapy: Secondary | ICD-10-CM | POA: Diagnosis not present

## 2020-09-17 DIAGNOSIS — F988 Other specified behavioral and emotional disorders with onset usually occurring in childhood and adolescence: Secondary | ICD-10-CM | POA: Diagnosis not present

## 2020-09-17 MED ORDER — AMPHETAMINE-DEXTROAMPHETAMINE 10 MG PO TABS: 20.0000 mg | ORAL_TABLET | Freq: Every day | ORAL | 0 refills | Status: DC | PRN

## 2020-09-17 MED ORDER — AMPHETAMINE-DEXTROAMPHET ER 20 MG PO CP24: 20.0000 mg | ORAL_CAPSULE | Freq: Every day | ORAL | 0 refills | Status: DC

## 2020-09-17 NOTE — Progress Notes (Signed)
Gary Jennings is a 30 y.o. male  Chief Complaint  Patient presents with   Follow-up    F/u for medication refills    HPI: Gary Jennings is a 30 y.o. male patient seen today for routine f/u on ADD and medication refill. Pt takes Adderall XR 20mg  qAM and Adderall 10mg  qPM. He often takes 20mg  (2 10mg  tabs) in PM and feels this is more effective at keeping him focused.  No side effects. Appetite is good, sleep is good. Pt denies HA, dizziness, palpitations, SOB, CP.  He is taking viibryd 30mg  daily. This is very effective and pt states he notices a difference if he happens to miss 1-2 doses. Does not need a refill at this time.   Past Medical History:  Diagnosis Date   Acne    ADD (attention deficit disorder)     History reviewed. No pertinent surgical history.  Social History   Socioeconomic History   Marital status: Single    Spouse name: Not on file   Number of children: Not on file   Years of education: Not on file   Highest education level: Not on file  Occupational History   Not on file  Tobacco Use   Smoking status: Never   Smokeless tobacco: Former  Use: Never used  Substance and Sexual Activity   Alcohol use: Yes    Comment: social   Drug use: No   Sexual activity: Not on file  Other Topics Concern   Not on file  Social History Narrative   Parents are devoiced; lives with mother   Single   Regular exercise-gulf   Occupation: [employer]   Social Determinants of Health   Financial Resource Strain: Not on file  Food Insecurity: Not on file  Transportation Needs: Not on file  Physical Activity: Not on file  Stress: Not on file  Social Connections: Not on file  Intimate Partner Violence: Not on file    Family History  Problem Relation Age of Onset   COPD Father      Immunization History  Administered Date(s) Administered   PFIZER(Purple Top)SARS-COV-2 Vaccination 11/04/2019   Tdap 06/30/2014     Outpatient Encounter Medications as of 09/17/2020  Medication Sig   amphetamine-dextroamphetamine (ADDERALL XR) 20 MG 24 hr capsule Take 1 capsule (20 mg total) by mouth daily.   amphetamine-dextroamphetamine (ADDERALL) 10 MG tablet Take 1 tablet (10 mg total) by mouth daily as needed. Take along with 20mg  XR   Vilazodone HCl (VIIBRYD) 20 MG TABS Take 1.5 tablets (30 mg total) by mouth daily.   [DISCONTINUED] nitroGLYCERIN (NITRODUR - DOSED IN MG/24 HR) 0.2 mg/hr patch Apply 1/4 patch daily to tendon for tendonitis. (Patient not taking: Reported on 09/17/2020)   No facility-administered encounter medications on file as of 09/17/2020.     ROS: Pertinent positives and negatives noted in HPI. Remainder of ROS non-contributory    Allergies  Allergen Reactions   Lexapro [Escitalopram Oxalate]     Suicidal thoughts   Ceftin [Cefuroxime Axetil]     n/v   Wellbutrin [Bupropion]     Anxiety, crying spells    BP 130/84 (BP Location: Left Arm, Patient Position: Sitting, Cuff Size: Normal)   Pulse (!) 55   Temp 97.9 F (36.6 C) (Temporal)   Ht 5\' 8"  (1.727 m)   Wt 209 lb 12.8 oz (95.2 kg)   SpO2 97%   BMI 31.90 kg/m  Wt  Readings from Last 3 Encounters:  09/17/20 209 lb 12.8 oz (95.2 kg)  06/23/20 207 lb 9.6 oz (94.2 kg)  03/06/20 205 lb (93 kg)   Temp Readings from Last 3 Encounters:  09/17/20 97.9 F (36.6 C) (Temporal)  02/07/19 97.9 F (36.6 C)  01/04/19 98.7 F (37.1 C) (Temporal)   BP Readings from Last 3 Encounters:  09/17/20 130/84  06/23/20 118/80  02/07/19 130/82   Pulse Readings from Last 3 Encounters:  09/17/20 (!) 55  06/23/20 (!) 57  02/07/19 (!) 55     Physical Exam Constitutional:      General: He is not in acute distress.    Appearance: He is not ill-appearing.  Cardiovascular:     Rate and Rhythm: Normal rate and regular rhythm.     Pulses: Normal pulses.     Heart sounds: Normal heart sounds.  Pulmonary:     Effort: Pulmonary effort is  normal.     Breath sounds: Normal breath sounds. No wheezing or rhonchi.  Neurological:     Mental Status: He is alert.  Psychiatric:        Mood and Affect: Mood normal.        Behavior: Behavior normal.     A/P:  1. Attention deficit disorder (ADD) without hyperactivity - database reviewed and appropriate - UDS today Increase: - amphetamine-dextroamphetamine (ADDERALL) 10 MG tablet; Take 2 tablets (20 mg total) by mouth daily as needed. Take along with 20mg  XR  Dispense: 60 tablet; Refill: 0 - pt takes 10-20mg  qPM around 2pm  Refill: - amphetamine-dextroamphetamine (ADDERALL XR) 20 MG 24 hr capsule; Take 1 capsule (20 mg total) by mouth daily.  Dispense: 30 capsule; Refill: 0 - DRUG MONITORING, PANEL 8 WITH CONFIRMATION, URINE; Future - f/u in 28mo - pt understands this will be with new PCP since I am leaving  2. High risk medications (not anticoagulants) long-term use - DRUG MONITORING, PANEL 8 WITH CONFIRMATION, URINE; Future    This visit occurred during the SARS-CoV-2 public health emergency.  Safety protocols were in place, including screening questions prior to the visit, additional usage of staff PPE, and extensive cleaning of exam room while observing appropriate contact time as indicated for disinfecting solutions.

## 2020-10-13 ENCOUNTER — Telehealth: Payer: Self-pay | Admitting: Nurse Practitioner

## 2020-10-13 DIAGNOSIS — F988 Other specified behavioral and emotional disorders with onset usually occurring in childhood and adolescence: Secondary | ICD-10-CM

## 2020-10-13 NOTE — Telephone Encounter (Signed)
(  Doc of the DAY): Dr Renaye Rakers pt, has no TOC set up. Called requesting that all three of his adderalls be refilled before his vacation next week.

## 2020-10-14 MED ORDER — AMPHETAMINE-DEXTROAMPHETAMINE 10 MG PO TABS: 20.0000 mg | ORAL_TABLET | Freq: Every day | ORAL | 0 refills | Status: DC | PRN

## 2020-10-14 MED ORDER — AMPHETAMINE-DEXTROAMPHET ER 20 MG PO CP24: 20.0000 mg | ORAL_CAPSULE | Freq: Every day | ORAL | 0 refills | Status: DC

## 2020-10-14 NOTE — Telephone Encounter (Signed)
Pt called back today to check in.

## 2020-10-14 NOTE — Telephone Encounter (Signed)
He needs to establish with new pcp in order to get additional refills Refill sent but he can only get 10mg  on 08/20 and 20mg  on 8/24.

## 2020-10-15 NOTE — Telephone Encounter (Signed)
Patient states he is leaving for the beach on 10/18/20 and would like to get medications before he leaves if possible. Please advise

## 2020-10-17 NOTE — Telephone Encounter (Signed)
Unable to leave voicemail due to full mailbox.   

## 2020-12-01 ENCOUNTER — Other Ambulatory Visit: Payer: Self-pay | Admitting: Family Medicine

## 2020-12-01 DIAGNOSIS — F988 Other specified behavioral and emotional disorders with onset usually occurring in childhood and adolescence: Secondary | ICD-10-CM

## 2020-12-01 MED ORDER — AMPHETAMINE-DEXTROAMPHETAMINE 10 MG PO TABS: 20.0000 mg | ORAL_TABLET | Freq: Every day | ORAL | 0 refills | Status: DC | PRN

## 2020-12-01 NOTE — Telephone Encounter (Signed)
What is the name of the medication? amphetamine-dextroamphetamine (ADDERALL) 10 MG tablet [[id number]]   Have you contacted your pharmacy to request a refill? I let pt know he has already gotten a 2 mon refill from [facility] and was suppose to get a toc earlier. He is now out his script, he has a med check with [facility] on 10/10 and a toc on 12/27  Which pharmacy would you like this sent to? [id number] [place], [place] - [address]  [address] [place], [place] [address]  Phone:  [phone]  Fax:  [phone]    Patient notified that their request is being sent to the clinical staff for review and that they should receive a call once it is complete. If they do not receive a call within 72 hours they can check with their pharmacy or our office.

## 2020-12-01 NOTE — Telephone Encounter (Signed)
Refill request for: Adderall 10 mg LR 10/18/20, #60, 0  LOV7/20/22  (Dr Barron Alvine) FOV  12/08/20 (Dr Veto Kemps)  Please review and advise.  Thanks. Dm/cma

## 2020-12-05 ENCOUNTER — Other Ambulatory Visit: Payer: Self-pay

## 2020-12-08 ENCOUNTER — Ambulatory Visit: Payer: No Typology Code available for payment source | Admitting: Family Medicine

## 2020-12-08 ENCOUNTER — Other Ambulatory Visit: Payer: Self-pay

## 2020-12-08 ENCOUNTER — Encounter: Payer: Self-pay | Admitting: Family Medicine

## 2020-12-08 VITALS — BP 118/70 | HR 68 | Temp 97.3°F | Ht 68.0 in | Wt 214.8 lb

## 2020-12-08 DIAGNOSIS — F411 Generalized anxiety disorder: Secondary | ICD-10-CM

## 2020-12-08 DIAGNOSIS — F988 Other specified behavioral and emotional disorders with onset usually occurring in childhood and adolescence: Secondary | ICD-10-CM

## 2020-12-08 DIAGNOSIS — E669 Obesity, unspecified: Secondary | ICD-10-CM | POA: Insufficient documentation

## 2020-12-08 MED ORDER — AMPHETAMINE-DEXTROAMPHET ER 20 MG PO CP24: 20.0000 mg | ORAL_CAPSULE | Freq: Every day | ORAL | 0 refills | Status: DC

## 2020-12-08 NOTE — Progress Notes (Signed)
[facility] [address] [place] [address] Dept: [phone] Dept Fax: [phone]  Transfer of Care Office Visit  Subjective:    Patient ID: Gary Jennings, male    DOB: [birth year], 30 y.o..   MRN: [id number]  Chief Complaint  Patient presents with   Establish Care    [facility]- establish care.    Declines flu shot.     History of Present Illness:  Patient is in today to establish care. Gary Jennings was born in [place]. He lived in [place], [place] for a couple fo years, but has otehrwise always been int he [employer] He attended [employer] and majored in Education officer, environmental. 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 He has a girlfriend (3 years) and no children. Gary Jennings denies tobacco or drug use. He only occasionally drinks alcohol.  Gary Jennings has a history of generalized anxiety. He is currently managed on vilazodone (Viibryd).   He also has a history of ADHD. Gary Jennings notes this was diagnosed in childhood. He was treated with Adderall during his teen years. He went off of medication for some time in his 63s. However, he started to note impacts on his work. He describes inattention/distractibility and an issue completing tasks. He notes off the medication, he has trouble concentrating and remembering tasks. He is currently managed on Adderall 20 mg XR q am and Adderall 10 mg (2 tabs) q pm.  Past Medical History: Patient Active Problem List   Diagnosis Date Noted   Patellar tendinitis, left knee 06/23/2020   Loose body in knee, right knee 06/23/2020   Generalized anxiety disorder 09/06/2016   GERD 01/03/2008   Attention deficit disorder 04/17/2007   History reviewed. No pertinent surgical history.  Family History  Problem Relation Age of Onset   COPD Father    Stroke Maternal Grandfather    Heart disease Maternal Grandfather    Stroke Paternal Grandfather    Outpatient Medications Prior to  Visit  Medication Sig Dispense Refill   amphetamine-dextroamphetamine (ADDERALL) 10 MG tablet Take 2 tablets (20 mg total) by mouth daily as needed. Take along with 20mg  XR. Need office visit with new pcp to get additional refills 60 tablet 0   Vilazodone HCl (VIIBRYD) 20 MG TABS Take 1.5 tablets (30 mg total) by mouth daily. 135 tablet 3   amphetamine-dextroamphetamine (ADDERALL XR) 20 MG 24 hr capsule Take 1 capsule (20 mg total) by mouth daily. Need office visit with new pcp to get additional refills 30 capsule 0   No facility-administered medications prior to visit.   Allergies  Allergen Reactions   Lexapro [Escitalopram Oxalate]     Suicidal thoughts   Ceftin [Cefuroxime Axetil]     n/v   Wellbutrin [Bupropion]     Anxiety, crying spells    Objective:   Today's Vitals   12/08/20 1405  BP: 118/70  Pulse: 68  Temp: (!) 97.3 F (36.3 C)  TempSrc: Temporal  SpO2: 97%  Weight: 214 lb 12.8 oz (97.4 kg)  Height: 5\' 8"  (1.727 m)   Body mass index is 32.66 kg/m.   General: Well developed, well nourished. No acute distress. Psych: Alert and oriented. Normal mood and affect.  Health Maintenance Due  Topic Date Due   HIV Screening  Never done   Hepatitis C Screening  Never done   COVID-19 Vaccine (2 - Pfizer risk series) 11/25/2019   INFLUENZA VACCINE  Never done  Assessment & Plan:   1. Attention deficit disorder (ADD) without hyperactivity I will plan to continue Gary Jennings on Adderall. I will follow-up with him in 6 months.  - amphetamine-dextroamphetamine (ADDERALL XR) 20 MG 24 hr capsule; Take 1 capsule (20 mg total) by mouth daily.  Dispense: 30 capsule; Refill: 0  2. Generalized anxiety disorder Stable on Viibryd.  Loyola Mast, MD

## 2021-01-12 ENCOUNTER — Telehealth: Payer: Self-pay | Admitting: Family Medicine

## 2021-01-12 DIAGNOSIS — F988 Other specified behavioral and emotional disorders with onset usually occurring in childhood and adolescence: Secondary | ICD-10-CM

## 2021-01-12 NOTE — Telephone Encounter (Signed)
What is the name of the medication? amphetamine-dextroamphetamine (ADDERALL XR) 20 MG 24 hr capsule [[id number]] and amphetamine-dextroamphetamine (ADDERALL) 10 MG tablet [[id number]]   Have you contacted your pharmacy to request a refill? Yes he is needing a refill/   Which pharmacy would you like this sent to? [id number] [place], [place] - [address]  [address] [place], [place] [address]  Phone:  [phone]  Fax:  [phone]   Patient notified that their request is being sent to the clinical staff for review and that they should receive a call once it is complete. If they do not receive a call within 72 hours they can check with their pharmacy or our office.

## 2021-01-13 MED ORDER — AMPHETAMINE-DEXTROAMPHETAMINE 10 MG PO TABS: 20.0000 mg | ORAL_TABLET | Freq: Every day | ORAL | 0 refills | Status: DC | PRN

## 2021-01-13 MED ORDER — AMPHETAMINE-DEXTROAMPHET ER 20 MG PO CP24: 20.0000 mg | ORAL_CAPSULE | Freq: Every day | ORAL | 0 refills | Status: DC

## 2021-01-14 NOTE — Telephone Encounter (Signed)
Patient notified VIA phone. Dm/cma  

## 2021-02-17 ENCOUNTER — Telehealth: Payer: Self-pay | Admitting: Family Medicine

## 2021-02-17 ENCOUNTER — Ambulatory Visit: Payer: No Typology Code available for payment source | Admitting: Family Medicine

## 2021-02-17 ENCOUNTER — Encounter: Payer: Self-pay | Admitting: Family Medicine

## 2021-02-17 ENCOUNTER — Other Ambulatory Visit: Payer: Self-pay

## 2021-02-17 DIAGNOSIS — F411 Generalized anxiety disorder: Secondary | ICD-10-CM

## 2021-02-17 DIAGNOSIS — F988 Other specified behavioral and emotional disorders with onset usually occurring in childhood and adolescence: Secondary | ICD-10-CM

## 2021-02-17 MED ORDER — AMPHETAMINE-DEXTROAMPHETAMINE 10 MG PO TABS: 20.0000 mg | ORAL_TABLET | Freq: Every day | ORAL | 0 refills | Status: DC | PRN

## 2021-02-17 MED ORDER — AMPHETAMINE-DEXTROAMPHET ER 20 MG PO CP24: 20.0000 mg | ORAL_CAPSULE | Freq: Every day | ORAL | 0 refills | Status: DC

## 2021-02-17 MED ORDER — VILAZODONE HCL 20 MG PO TABS: 30.0000 mg | ORAL_TABLET | Freq: Every day | ORAL | 3 refills | Status: DC

## 2021-02-17 NOTE — Telephone Encounter (Signed)
Pt called and asked if there was an update on PA for his Viibryd but I checked with Angelique Blonder and we havent received anything for it, This medication was last filled December of last year and per Dr Veto Kemps pt would need an appt to discuss, I let pt know and he said he thought it was just a PA that needed to be done because he said his pharmacy said that they sent to [place] for one. He said he is going to contact his insurance and call us back.

## 2021-02-17 NOTE — Telephone Encounter (Signed)
Sched pt for 12/20 at 4pm.

## 2021-02-17 NOTE — Telephone Encounter (Signed)
Lft  detailed VM to rtn call to schedule an appointment to discuss getting a refill on Viibryd.  Dm/cma

## 2021-02-17 NOTE — Progress Notes (Signed)
[facility] [address] [place] [address] Dept: [phone] Dept Fax: [phone]  Chronic Care Office Visit  Subjective:    Patient ID: Gary Jennings, male    DOB: [birth year], 30 y.o..   MRN: [id number]  Chief Complaint  Patient presents with   Follow-up    F/u meds/refills Viibryd.  C/o having some ear popping.  Declines flu shot today.     History of Present Illness:  Patient is in today for reassessment of chronic medical issues.  [name] has a history of generalized anxiety. He is currently managed on vilazodone (Viibryd). He notes that prior to starting the medication, he struggled with getting out of the bed int he morning. He had racing thoughts. He occasionally would break down and cry. He found the Viibryd dramatically improved those symptoms. He has some minor decrease in libido with the medicine, but feels he can manage with this.   [name] has ADHD. He is currently managed on Adderall 20 mg XR q am and Adderall 10 mg (2 tabs) q pm. He does not take this on weekends when possible.  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  Family History  Problem Relation Age of Onset   COPD Father    Stroke Maternal Grandfather    Heart disease Maternal Grandfather    Stroke Paternal Grandfather    Outpatient Medications Prior to Visit  Medication Sig Dispense Refill   amphetamine-dextroamphetamine (ADDERALL XR) 20 MG 24 hr capsule Take 1 capsule (20 mg total) by mouth daily. 30 capsule 0   amphetamine-dextroamphetamine (ADDERALL) 10 MG tablet Take 2 tablets (20 mg total) by mouth daily as needed. Take along with 20mg   XR. Need office visit with new pcp to get additional refills 60 tablet 0   Vilazodone HCl (VIIBRYD) 20 MG TABS Take 1.5 tablets (30 mg total) by mouth daily. 135 tablet 3   No facility-administered medications prior to visit.   Allergies  Allergen Reactions   Lexapro [Escitalopram Oxalate]     Suicidal thoughts   Ceftin [Cefuroxime Axetil]     n/v   Wellbutrin [Bupropion]     Anxiety, crying spells     Objective:   Today's Vitals   02/17/21 1600  BP: 120/78  Pulse: 70  Temp: (!) 97.4 F (36.3 C)  TempSrc: Temporal  SpO2: 98%  Weight: 214 lb 12.8 oz (97.4 kg)  Height: 5\' 8"  (1.727 m)   Body mass index is 32.66 kg/m.   General: Well developed, well nourished. No acute distress. Psych: Alert and oriented. Normal mood and affect.  Health Maintenance Due  Topic Date Due   Pneumococcal Vaccine 31-84 Years old (1 - PCV) Never done   HIV Screening  Never done   Hepatitis C Screening  Never done   COVID-19 Vaccine (2 - Pfizer risk series) 11/25/2019   INFLUENZA VACCINE  Never done     Assessment & Plan:   1. Attention deficit disorder (ADD) without hyperactivity I will renew his Adderall.  - amphetamine-dextroamphetamine (ADDERALL XR) 20 MG 24 hr capsule; Take 1 capsule (20 mg total) by mouth daily.  Dispense: 30 capsule; Refill: 0 - amphetamine-dextroamphetamine (ADDERALL) 10  MG tablet; Take 2 tablets (20 mg total) by mouth daily as needed. Take along with 20mg  XR. Need office visit with new pcp to get additional refills  Dispense: 60 tablet; Refill: 0  2. Generalized anxiety disorder Mr. Lotts is stable on the vilazodone. He appears to have had an excellent response to the medication. I will continue this for long-term maintenance therapy.  - Vilazodone HCl (VIIBRYD) 20 MG TABS; Take 1.5 tablets (30 mg total) by mouth daily.  Dispense: 135 tablet; Refill: 3  Wyline Mood, MD

## 2021-02-24 ENCOUNTER — Encounter: Payer: No Typology Code available for payment source | Admitting: Family Medicine

## 2021-03-16 ENCOUNTER — Telehealth: Payer: Self-pay

## 2021-03-16 ENCOUNTER — Telehealth: Payer: Self-pay | Admitting: Family Medicine

## 2021-03-16 DIAGNOSIS — F411 Generalized anxiety disorder: Secondary | ICD-10-CM

## 2021-03-16 MED ORDER — VILAZODONE HCL 20 MG PO TABS: 30.0000 mg | ORAL_TABLET | Freq: Every day | ORAL | 3 refills | Status: DC

## 2021-03-16 NOTE — Telephone Encounter (Signed)
Spoke to patient, his insurance is requiring him to use [facility] ([address]). He went to get his refill on the Viibryd 20 mg, and was advised of this. Can you please send a new RX to [facility]?  Thanks Dm/cma

## 2021-03-16 NOTE — Telephone Encounter (Signed)
Lft VM that RX has been re-sent to the [facility].  If any problems to let us know.  Dm/cma

## 2021-03-16 NOTE — Addendum Note (Signed)
Addended by: Loyola Mast on: 03/16/2021 10:35 AM   Modules accepted: Orders

## 2021-03-16 NOTE — Telephone Encounter (Signed)
Received a PA for the Viibryd 20 mg, called [phone], spoke to [place], she will get a PA form faxed to our office to be filled out and faxed back to them with OV notes.  Dm/cma

## 2021-03-17 NOTE — Telephone Encounter (Signed)
PA form received/filled out/faxed back to [facility] @ [phone].  Waiting on response. diabetes mellitus c

## 2021-03-18 NOTE — Telephone Encounter (Signed)
PA approved from 03/16/21 - 03/16/22.   Pharmacy notified VIA phone.  Dm/cma

## 2021-03-26 ENCOUNTER — Other Ambulatory Visit: Payer: Self-pay | Admitting: Family Medicine

## 2021-03-26 DIAGNOSIS — F988 Other specified behavioral and emotional disorders with onset usually occurring in childhood and adolescence: Secondary | ICD-10-CM

## 2021-03-27 MED ORDER — AMPHETAMINE-DEXTROAMPHET ER 20 MG PO CP24: 20.0000 mg | ORAL_CAPSULE | Freq: Every day | ORAL | 0 refills | Status: DC

## 2021-03-27 MED ORDER — AMPHETAMINE-DEXTROAMPHETAMINE 10 MG PO TABS: 20.0000 mg | ORAL_TABLET | Freq: Every day | ORAL | 0 refills | Status: DC | PRN

## 2021-03-27 NOTE — Telephone Encounter (Signed)
Patient notified VIA phone. Dm/cma  

## 2021-03-27 NOTE — Telephone Encounter (Signed)
Refill request for:  Adderall XR 20 mg LR 02/17/21, #30, 0 rf  Adderall 10 mg LR 02/17/21, #60, 0 rf LOV 02/17/21 FOV   none scheduled.  Please review and advise.  Thanks. Dm/cma

## 2021-04-14 ENCOUNTER — Telehealth: Payer: Self-pay

## 2021-04-14 NOTE — Telephone Encounter (Signed)
Received a PA for Adderall XR 20 mg.  Called insurance to start PA at [phone], gave information and they will fax over a form to the office to be filled out.   Dm/cma

## 2021-04-16 NOTE — Telephone Encounter (Signed)
PA approved from 04/14/21 - 04/14/2022 or which ever is earlier. Pharmacy notified VIA phone.  Dm/cma

## 2021-04-16 NOTE — Telephone Encounter (Signed)
PA filled out/signed/faxed to [facility]  @ [phone].    Awating response. Dm/cma

## 2021-04-28 ENCOUNTER — Encounter: Payer: Self-pay | Admitting: Family Medicine

## 2021-04-28 ENCOUNTER — Other Ambulatory Visit: Payer: Self-pay

## 2021-04-28 ENCOUNTER — Ambulatory Visit: Payer: No Typology Code available for payment source | Admitting: Family Medicine

## 2021-04-28 VITALS — BP 114/78 | HR 66 | Temp 97.4°F | Ht 68.0 in | Wt 213.6 lb

## 2021-04-28 DIAGNOSIS — F411 Generalized anxiety disorder: Secondary | ICD-10-CM | POA: Diagnosis not present

## 2021-04-28 DIAGNOSIS — G4489 Other headache syndrome: Secondary | ICD-10-CM

## 2021-04-28 NOTE — Progress Notes (Signed)
[facility] [address] [place] [address] Dept: [phone] Dept Fax: [phone]  Office Visit  Subjective:    Patient ID: Gary Jennings, male    DOB: 1990-11-24, 31 y.o..   MRN: [id number]  Chief Complaint  Patient presents with   Acute Visit    C/o having fogginess/dizziness/Head pain x 4 days.  Has taken Tylenol and Excedrin.  Declines flu shot and any further Covid shots.     History of Present Illness:  Patient is in today complaining of a 4-day history of headache, brain fog, and dizziness. He notes the headache comes in brief jolts of pain. He denies any associated visual changes, photophobia, phonophobia, nausea, or difficulty with sleep. He is eating and drinking well and has tried to remain active. [name] notes that this feels similar to times when he has missed a dose of his vilazodone. However, he notes he has not missed any doses. He did get a new prescription for this filled 2 days prior to the symptoms developing. He takes 1 1/2 tabs daily and notes the tabs are scored. He did not note any difference in the appearance of his medicine. He did try on Sunday taking 2 tabs, but did not note this improved his symptoms. He continues to be on his usual Adderall.  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  Family History  Problem Relation Age of Onset   COPD Father    Stroke Maternal Grandfather    Heart disease Maternal Grandfather    Stroke Paternal Grandfather    Outpatient Medications Prior to Visit  Medication Sig Dispense Refill   amphetamine-dextroamphetamine (ADDERALL XR) 20 MG 24 hr capsule Take 1 capsule  (20 mg total) by mouth daily. 30 capsule 0   amphetamine-dextroamphetamine (ADDERALL) 10 MG tablet Take 2 tablets (20 mg total) by mouth daily as needed. Take along with 20mg  XR. Need office visit with new pcp to get additional refills 60 tablet 0   Vilazodone HCl (VIIBRYD) 20 MG TABS Take 1.5 tablets (30 mg total) by mouth daily. 135 tablet 3   No facility-administered medications prior to visit.   Allergies  Allergen Reactions   Lexapro [Escitalopram Oxalate]     Suicidal thoughts   Ceftin [Cefuroxime Axetil]     n/v   Wellbutrin [Bupropion]     Anxiety, crying spells      Objective:   Today's Vitals   04/28/21 0903  BP: 114/78  Pulse: 66  Temp: (!) 97.4 F (36.3 C)  TempSrc: Temporal  SpO2: 97%  Weight: 213 lb 9.6 oz (96.9 kg)  Height: 5\' 8"  (1.727 m)   Body mass index is 32.48 kg/m.   General: Well developed, well nourished. No acute distress. HEENT: Normocephalic, non-traumatic. PERRL, EOMI. Conjunctiva clear. Fundiscopic exam shows normal   disc and vasculature. External ears normal. EAC and TMs normal bilaterally. Nose    clear without congestion or rhinorrhea. Mucous membranes moist. Oropharynx clear. Good dentition. Neck: Supple. No lymphadenopathy. No thyromegaly. Lungs: Clear to auscultation bilaterally. No wheezing, rales or rhonchi. CV: RRR without murmurs or rubs. Pulses 2+ bilaterally. Neuro: No focal neurological deficits. Psych: Alert and oriented. Normal mood  and affect.  Health Maintenance Due  Topic Date Due   HIV Screening  Never done   Hepatitis C Screening  Never done     Assessment & Plan:   1. Other headache syndrome 2. Generalized anxiety disorder [name] symptoms are consistent with a discontinuation syndrome for vilazodone, however, he is not apparently off of his medication. Although here can be subtle differences in the daily dose when splitting a tablet, I don';t think this clearly explains his issue. I recommend we observe this  for now. He should try and remain active, eat and hydrate well, and get regular sleep. If not improving over the next week, I recommend he reach out to me through My Chart. We would then try to get him a new fill of his vilazodone, in case there was an issue with his current prescription.  Return in about 1 week (around 05/05/2021), or if symptoms worsen or fail to improve.   Loyola Mast, MD

## 2021-04-28 NOTE — Patient Instructions (Signed)
Contact Dr. Veto Kemps via My Chart if symptoms do not improve/resolve over the next week.

## 2021-05-04 ENCOUNTER — Other Ambulatory Visit: Payer: Self-pay | Admitting: Family Medicine

## 2021-05-04 DIAGNOSIS — F988 Other specified behavioral and emotional disorders with onset usually occurring in childhood and adolescence: Secondary | ICD-10-CM

## 2021-05-04 MED ORDER — AMPHETAMINE-DEXTROAMPHETAMINE 10 MG PO TABS: 20.0000 mg | ORAL_TABLET | Freq: Every day | ORAL | 0 refills | Status: DC | PRN

## 2021-05-04 MED ORDER — AMPHETAMINE-DEXTROAMPHET ER 20 MG PO CP24: 20.0000 mg | ORAL_CAPSULE | Freq: Every day | ORAL | 0 refills | Status: DC

## 2021-05-04 NOTE — Addendum Note (Signed)
Addended by: Waymond Cera on: 05/04/2021 04:28 PM ? ? Modules accepted: Orders ? ?

## 2021-05-04 NOTE — Telephone Encounter (Signed)
Can you send the Adderall 10 mg as well.   ?Sorry about that.  ?Thanks. Dm/cma ? ?

## 2021-05-04 NOTE — Telephone Encounter (Signed)
Refill request for: ?Adderall XR 20 mg  ?LR 03/27/21, #30, 0 rf ?LOV 04/28/21 ?FOV  none scheduled.  ? ?Please review and advise.  ?Thanks. Dm/cma ? ?

## 2021-05-04 NOTE — Telephone Encounter (Signed)
Caller Name: Timathy Newberry ?Call back phone #: [phone] ? ?MEDICATION(S): 1. amphetamine-dextroamphetamine (ADDERALL XR) 20 MG 24 hr capsule ?2. amphetamine-dextroamphetamine (ADDERALL) 10 MG tablet ? ? ?Days of Med Remaining: a few days worth ? ?Has the patient contacted their pharmacy (YES/NO)?  No because its controlled ?IF YES, when and what did the pharmacy advise?  ?IF NO, request that the patient contact the pharmacy for the refills in the future.  ?           The pharmacy will send an electronic request (except for controlled medications). ? ?Preferred Pharmacy: [facility] on [address] ? ?~~~Please advise patient/caregiver to allow 2-3 business days to process RX refills.  ?

## 2021-06-15 ENCOUNTER — Other Ambulatory Visit: Payer: Self-pay | Admitting: Family Medicine

## 2021-06-15 DIAGNOSIS — F988 Other specified behavioral and emotional disorders with onset usually occurring in childhood and adolescence: Secondary | ICD-10-CM

## 2021-06-15 MED ORDER — AMPHETAMINE-DEXTROAMPHETAMINE 10 MG PO TABS: 20.0000 mg | ORAL_TABLET | Freq: Every day | ORAL | 0 refills | Status: DC | PRN

## 2021-06-15 MED ORDER — AMPHETAMINE-DEXTROAMPHET ER 20 MG PO CP24: 20.0000 mg | ORAL_CAPSULE | Freq: Every day | ORAL | 0 refills | Status: DC

## 2021-06-15 NOTE — Telephone Encounter (Signed)
Caller Name: Neshawn Aird ? ? ?MEDICATION(S): Both Adderalls ? ? ? ? ? ? ?~~~Please advise patient/caregiver to allow 2-3 business days to process RX refills.  ?

## 2021-06-15 NOTE — Telephone Encounter (Signed)
Refill request for  ? ?Adderall Xr 20 mg ?LR 05/04/21, #30, 0 rf ? ?Adderall 10 mg ?LR 05/04/21, #60, 0 rf ?LOV 04/28/21 ?FOV  none scheduled.   ? ?Please review and advise.  ?Thanks.  ?Dm/cma ? ?

## 2021-07-16 ENCOUNTER — Ambulatory Visit: Payer: No Typology Code available for payment source | Admitting: Family Medicine

## 2021-07-16 ENCOUNTER — Encounter: Payer: Self-pay | Admitting: Family Medicine

## 2021-07-16 VITALS — BP 135/93 | HR 74 | Temp 96.0°F | Ht 68.0 in | Wt 209.2 lb

## 2021-07-16 DIAGNOSIS — B350 Tinea barbae and tinea capitis: Secondary | ICD-10-CM | POA: Diagnosis not present

## 2021-07-16 DIAGNOSIS — N489 Disorder of penis, unspecified: Secondary | ICD-10-CM | POA: Diagnosis not present

## 2021-07-16 NOTE — Assessment & Plan Note (Signed)
Painless penile lesion Very necessary to rule out syphilis, will send RPR We will also need to rule out HSV, although appears uncharacteristic We will order HIV to rule out  Given no other infectious symptoms, will not pursue gonorrhea or chlamydia at this time Given his painless, it is less likely chancroid Also cannot rule out HPV, although again does appear uncontrasted If all work-up negative, can pursue treatment for local skin versus fungal infection

## 2021-07-16 NOTE — Progress Notes (Signed)
Acute Office Visit  Subjective:     Patient ID: Gary Jennings, male    DOB: 06/12/1990, 31 y.o.   MRN: [id number]  Chief Complaint  Patient presents with   Acute Visit    Pt c/o soars back and side of scalp x4 months states it comes and goes. Has used assorted shampoos to help the issue  Also around the genital area that started today. Neither are painful.    No pain with urination, never had it before, has not been sexually active in several months, denies sex with men, denies penile discharge, no IV drug use. Does have a new male romantic partner. Says he only engaged in kissing with her, denies any oral sex, but does endorse that she said she had genital HSV  Rash This is a new problem. The current episode started today. The problem is unchanged. The affected locations include the genitalia. Rash characteristics: none. He was exposed to nothing (denies new sexual activity). Pertinent negatives include no anorexia, congestion, cough, diarrhea, eye pain, facial edema, fatigue, fever, joint pain, nail changes, rhinorrhea, shortness of breath, sore throat or vomiting. Past treatments include nothing. There is no history of allergies, asthma, eczema or varicella.    Patient also complains of rash on scalp, it is dry and flaky.  It has been there for several months.  Patient has tried multiple over-the-counter regimens for dandruff without improvement.  Denies using any Nizoral versus ketoconazole.  Review of Systems  Constitutional:  Negative for fatigue and fever.  HENT:  Negative for congestion, rhinorrhea and sore throat.   Eyes:  Negative for pain.  Respiratory:  Negative for cough and shortness of breath.   Gastrointestinal:  Negative for anorexia, diarrhea and vomiting.  Musculoskeletal:  Negative for joint pain.  Skin:  Positive for rash. Negative for nail changes.       Objective:    BP (!) 135/93 (BP Location: Left Arm, Patient Position: Sitting, Cuff Size: Normal)    Pulse 74   Temp (!) 96 F (35.6 C) (Temporal)   Ht 5\' 8"  (1.727 m)   Wt 209 lb 3.2 oz (94.9 kg)   SpO2 98%   BMI 31.81 kg/m    Gen: NAD, resting comfortably GU: Erythematous papule, does not appear ulcerative, nontender, no drainage along right side of shaft towards the head of the penis, there are no other lesions Skin: Flaking posterior scalp with some erythema, very mild alopecia Neuro: grossly normal, moves all extremities Psych: Anxious affect and thought content   No results found for any visits on 07/16/21.      Assessment & Plan:   Problem List Items Addressed This Visit       Musculoskeletal and Integument   Tinea capitis    Trial OTC ketoconazole 1% shampoo, if no improvement, f/u          Genitourinary   Penile lesion - Primary    Painless penile lesion Very necessary to rule out syphilis, will send RPR We will also need to rule out HSV, although appears uncharacteristic We will order HIV to rule out  Given no other infectious symptoms, will not pursue gonorrhea or chlamydia at this time Given his painless, it is less likely chancroid Also cannot rule out HPV, although again does appear uncontrasted If all work-up negative, can pursue treatment for local skin versus fungal infection       Relevant Orders   RPR   HIV antibody (with reflex)   Herpes simplex  virus (HSV), DNA by PCR    No orders of the defined types were placed in this encounter.   Return in about 1 week (around 07/23/2021).  Bonnita Hollow, MD

## 2021-07-16 NOTE — Assessment & Plan Note (Signed)
Trial OTC ketoconazole 1% shampoo, if no improvement, f/u

## 2021-07-16 NOTE — Patient Instructions (Signed)
Pleasure meeting today.  We are doing testing for HSV, syphilis, HIV.  Although HIV does not cause ulcers, it is a diagnosis we do not mount to miss especially if we have syphilis as these can get it. Pending these results, if negative, we can pursue treatment for local bacterial such as a minor skin infection, nonspecific viral, or local fungal infection such as yeast. Please follow-up via MyChart or in the clinic on the progress of the lesion.

## 2021-07-17 ENCOUNTER — Other Ambulatory Visit: Payer: Self-pay | Admitting: Family Medicine

## 2021-07-17 ENCOUNTER — Ambulatory Visit: Payer: No Typology Code available for payment source | Admitting: Family Medicine

## 2021-07-17 DIAGNOSIS — F988 Other specified behavioral and emotional disorders with onset usually occurring in childhood and adolescence: Secondary | ICD-10-CM

## 2021-07-17 LAB — HIV ANTIBODY (ROUTINE TESTING W REFLEX): HIV 1&2 Ab, 4th Generation: NONREACTIVE

## 2021-07-17 LAB — RPR: RPR Ser Ql: NONREACTIVE

## 2021-07-17 MED ORDER — AMPHETAMINE-DEXTROAMPHET ER 20 MG PO CP24: 20.0000 mg | ORAL_CAPSULE | Freq: Every day | ORAL | 0 refills | Status: DC

## 2021-07-17 MED ORDER — AMPHETAMINE-DEXTROAMPHETAMINE 10 MG PO TABS: 20.0000 mg | ORAL_TABLET | Freq: Every day | ORAL | 0 refills | Status: DC | PRN

## 2021-07-20 ENCOUNTER — Encounter: Payer: Self-pay | Admitting: Family Medicine

## 2021-07-20 ENCOUNTER — Ambulatory Visit: Payer: No Typology Code available for payment source | Admitting: Family Medicine

## 2021-07-20 VITALS — BP 118/76 | HR 68 | Temp 97.4°F | Ht 68.0 in | Wt 204.8 lb

## 2021-07-20 DIAGNOSIS — F988 Other specified behavioral and emotional disorders with onset usually occurring in childhood and adolescence: Secondary | ICD-10-CM | POA: Diagnosis not present

## 2021-07-20 DIAGNOSIS — R300 Dysuria: Secondary | ICD-10-CM

## 2021-07-20 NOTE — Progress Notes (Signed)
[facility] [address] [place] [address] Dept: [phone] Dept Fax: [phone]  Office Visit  Subjective:    Patient ID: Gary Jennings, male    DOB: Jun 30, 1990, 31 y.o..   MRN: [id number]  Chief Complaint  Patient presents with   Follow-up    F/u ADHD meds. Also having frequent urination and burning sensation.     History of Present Illness:  Patient is in today complaining of urinary urgency that is relieved with urination. Gary Jennings was seen recently with a bump on his penis. They developed a few days after he spent the night with someone he met in a bar. He notes that she came back to his room. She then disclosed a history of genital herpes, though she is taking daily medication for this. She had not had a recent outbreak. Gary Jennings had decided not to engage in intercourse, but she did spend the night. The bump was nontender and is now mostly resolved. He saw Dr. Grandville Silos about this last week. He had RPR, HIV, and HSV testing performed. The first two were negative. The HSV is still pending. Gary Jennings notes the current urinary symptoms. He admits he may not have been hydrating enough. He also notes that this situation has him feeling quite worried. He has a history of generalized anxiety, currently managed on vilazodone.   Gary Jennings has ADHD. He is currently managed on Adderall 20 mg XR q am and Adderall 10 mg (2 tabs) q pm. He feels like this continues to work well for him overall.  Past Medical History: Patient Active Problem List   Diagnosis Date Noted   Penile lesion 07/16/2021   Tinea capitis 07/16/2021   Obesity (BMI 30.0-34.9) 12/08/2020   Patellar tendinitis, left knee 06/23/2020   Loose body in knee, right knee 06/23/2020   Generalized anxiety disorder 09/06/2016   GERD 01/03/2008   Attention deficit disorder 04/17/2007   No past surgical history on file.  Family History  Problem Relation Age of  Onset   COPD Father    Stroke Maternal Grandfather    Heart disease Maternal Grandfather    Stroke Paternal Grandfather    Outpatient Medications Prior to Visit  Medication Sig Dispense Refill   amphetamine-dextroamphetamine (ADDERALL XR) 20 MG 24 hr capsule Take 1 capsule (20 mg total) by mouth daily. 30 capsule 0   amphetamine-dextroamphetamine (ADDERALL) 10 MG tablet Take 2 tablets (20 mg total) by mouth daily as needed. 60 tablet 0   Vilazodone HCl (VIIBRYD) 20 MG TABS Take 1.5 tablets (30 mg total) by mouth daily. 135 tablet 3   No facility-administered medications prior to visit.   Allergies  Allergen Reactions   Lexapro [Escitalopram Oxalate]     Suicidal thoughts   Ceftin [Cefuroxime Axetil]     n/v   Wellbutrin [Bupropion]     Anxiety, crying spells     Objective:   Today's Vitals   07/20/21 1321  BP: 118/76  Pulse: 68  Temp: (!) 97.4 F (36.3 C)  TempSrc: Temporal  SpO2: 97%  Weight: 204 lb 12.8 oz (92.9 kg)  Height: $Remove'5\' 8"'FwdLBno$  (1.727 m)   Body mass index is 31.14 kg/m.   General: Well developed, well nourished. No acute distress. Psych: Alert and oriented. Normal mood and affect.  Health Maintenance Due  Topic Date Due   Hepatitis C Screening  Never done   Lab Results Component Ref Range & Units 4 d ago  HIV 1&2  Ab, 4th Generation NON-REACTIVE NON-REACTIVE    Component Ref Range & Units 4 d ago  RPR Ser Ql NON-REACTIVE NON-REACTIVE       Assessment & Plan:   1. Dysuria I suspect Gary Jennings is not hydrating enough and therefore noticing the urgency sensation from concentrated urine. I will check a UA> As he did not have GC/Chlamydia testing with his recent scare, I will add this. Gary Jennings underlying anxiety is likely playing a factor in all of this. He feels reassured today, but will feel better when he knows for sure about the HSV.  - Urinalysis w microscopic + reflex cultur - Chlamydia/GC NAA, Confirmation  2. Attention deficit disorder (ADD)  without hyperactivity Stable. Continue Adderall 20 mg XR q am and Adderall 10 mg (2 tabs) q pm.  Return in about 6 months (around 01/20/2022).   Haydee Salter, MD

## 2021-07-21 LAB — NO CULTURE INDICATED

## 2021-07-21 LAB — URINALYSIS W MICROSCOPIC + REFLEX CULTURE
Bacteria, UA: NONE SEEN /HPF
Bilirubin Urine: NEGATIVE
Glucose, UA: NEGATIVE
Hgb urine dipstick: NEGATIVE
Hyaline Cast: NONE SEEN /LPF
Ketones, ur: NEGATIVE
Leukocyte Esterase: NEGATIVE
Nitrites, Initial: NEGATIVE
Protein, ur: NEGATIVE
RBC / HPF: NONE SEEN /HPF (ref 0–2)
Specific Gravity, Urine: 1.024 (ref 1.001–1.035)
Squamous Epithelial / HPF: NONE SEEN /HPF (ref <=5)
WBC, UA: NONE SEEN /HPF (ref 0–5)
pH: 7 (ref 5.0–8.0)

## 2021-07-22 LAB — SPECIMEN STATUS REPORT

## 2021-07-22 LAB — HSV DNA BY PCR (REFERENCE LAB)
HSV 2 DNA: NEGATIVE
HSV-1 DNA: NEGATIVE

## 2021-07-29 ENCOUNTER — Telehealth: Payer: Self-pay

## 2021-07-29 NOTE — Telephone Encounter (Signed)
Received results from [facility], placed them in box due to not in our system.   Please review and advise.  Thanks. Dm/cma

## 2021-07-29 NOTE — Telephone Encounter (Signed)
Left VM to rtn call to let patient know of lab results that are not in chart but have been mailed to [place]. Dm/cma

## 2021-07-30 NOTE — Telephone Encounter (Signed)
Patient notified VIA phone of negative results.  No questions.  Dm/cma

## 2021-08-11 ENCOUNTER — Telehealth: Payer: Self-pay | Admitting: Family Medicine

## 2021-08-11 NOTE — Telephone Encounter (Signed)
Pt is having difficulty getting his higher dose amphetamine-dextroamphetamine (ADDERALL XR) 20 MG 24 hr capsule [[id number]. He is wondering for right now can he get more of his amphetamine-dextroamphetamine (ADDERALL) 10 MG tablet [[id number]]. The pharmacy is not able to fill the 20mg  in the for seeable future.  [id number] [place] - [address] AT [facility] [address]  [address] [place]  Phone:  [phone]  Fax:  [phone]  DEA #:  [phone] Please contact pt is any further questions at [phone]

## 2021-08-11 NOTE — Telephone Encounter (Signed)
Spoke to patient and advised to call and find a pharmacy that has it and call us back. Dm/cma

## 2021-08-17 ENCOUNTER — Telehealth: Payer: Self-pay | Admitting: Family Medicine

## 2021-08-17 DIAGNOSIS — F988 Other specified behavioral and emotional disorders with onset usually occurring in childhood and adolescence: Secondary | ICD-10-CM

## 2021-08-17 NOTE — Telephone Encounter (Signed)
Pt stated that the pharmacy never has his adoral ER 20mg  in stock but they always have his 10 mg tablet. He would like to change to the equivalent of the 2together to one using the 10 mg. Please advise.

## 2021-08-18 NOTE — Telephone Encounter (Signed)
Spoke to patient, he called several [facility] and they dont' have  the Adderall.  I advised him to call [facility], [facility], [facility], [facility] and see if any of them have the Rx and then call us back. Dm/cma

## 2021-08-18 NOTE — Telephone Encounter (Signed)
Lft VM to rtn call to see if he called other pharmacies to see if they had the prescription. Dm/cma

## 2021-08-21 ENCOUNTER — Other Ambulatory Visit: Payer: Self-pay | Admitting: Family Medicine

## 2021-08-21 DIAGNOSIS — F988 Other specified behavioral and emotional disorders with onset usually occurring in childhood and adolescence: Secondary | ICD-10-CM

## 2021-08-21 MED ORDER — LISDEXAMFETAMINE DIMESYLATE 20 MG PO CAPS: 20.0000 mg | ORAL_CAPSULE | Freq: Every day | ORAL | 0 refills | Status: DC

## 2021-08-21 MED ORDER — AMPHETAMINE-DEXTROAMPHETAMINE 10 MG PO TABS: 20.0000 mg | ORAL_TABLET | Freq: Every day | ORAL | 0 refills | Status: DC | PRN

## 2021-08-21 NOTE — Telephone Encounter (Signed)
Spoke to patient, he is unable to find any pharmacies that have enough to fill a whole RX.  Is there a way he can get something other that the Adderall XR 20 mg? Please review and advise.  Thanks. Dm/cma

## 2021-08-25 ENCOUNTER — Telehealth: Payer: Self-pay | Admitting: Family Medicine

## 2021-08-25 NOTE — Telephone Encounter (Signed)
Received as on 08/24/21 and will try to get submitted today. Dm/cma

## 2021-08-26 NOTE — Telephone Encounter (Signed)
PA submitted by fax to [facility] at [phone]. Waiting on response. Can take 24-72 hours before we get a response.  Patient notified VIA phone. Dm/cma

## 2021-08-27 NOTE — Telephone Encounter (Signed)
PA approved from 08/27/21 - 08/26/22. Pharmacy notified VIA phone and left detailed VM on patients phone. Dm/cma

## 2021-09-21 ENCOUNTER — Other Ambulatory Visit: Payer: Self-pay | Admitting: Family Medicine

## 2021-09-21 DIAGNOSIS — F988 Other specified behavioral and emotional disorders with onset usually occurring in childhood and adolescence: Secondary | ICD-10-CM

## 2021-09-21 MED ORDER — LISDEXAMFETAMINE DIMESYLATE 20 MG PO CAPS: 20.0000 mg | ORAL_CAPSULE | Freq: Every day | ORAL | 0 refills | Status: DC

## 2021-09-21 NOTE — Telephone Encounter (Signed)
Caller Name: Dreden Rivere Call back phone #: [phone]  Reason for Call: Pt called in for an update, needs lisdexamfetamine refilled to the pharmacy. Has a couple left

## 2021-09-22 ENCOUNTER — Telehealth: Payer: Self-pay | Admitting: Family Medicine

## 2021-09-22 NOTE — Telephone Encounter (Signed)
.  Caller Name: pt Call back phone #:[phone]   MEDICATION(S): amphetamine-dextroamphetamine (ADDERALL XR) 10 MG  Days of Med Remaining:   Has the patient contacted their pharmacy (YES/NO)? Yes, contact pcp    Preferred Pharmacy: [id number] [place], [place] - [address] AT [facility] [address]  [address] [place] [address]  Phone:  [phone]  Fax:  [phone]  DEA #:  [id number]  ~~~Please advise patient/caregiver to allow 2-3 business days to process RX refills.

## 2021-09-22 NOTE — Telephone Encounter (Signed)
Lft VM to rtn call. Dm/cma  

## 2021-09-23 NOTE — Telephone Encounter (Signed)
Lft VM to rtn call. Dm/cma  

## 2021-09-25 NOTE — Telephone Encounter (Signed)
Spoke tp patient and he wants the Adderall 10 mg sent to his pharmacy for him due to the Vyvance is wearing off about noon everyday.  Please review and advise.  Thanks. Dm/cma

## 2021-09-28 NOTE — Telephone Encounter (Signed)
Lft VM to rtn call to schedule an appointment.  Dm/cma  

## 2021-09-29 NOTE — Telephone Encounter (Signed)
Spoke to patient and advised that he would have to wait for his appointment for any medication changes.  He is okay for now and will see [place] on 10/06/21. Dm/cma

## 2021-09-29 NOTE — Telephone Encounter (Signed)
Pt has an appt 8/08 to speak about medication management. Asked if he could get a temporary refill to get him to his appointment. York Spaniel he has been taking this for years.

## 2021-09-29 NOTE — Telephone Encounter (Signed)
Has an appointment on 10/06/21 to discuss meds.  Dm/cma

## 2021-10-06 ENCOUNTER — Ambulatory Visit: Payer: No Typology Code available for payment source | Admitting: Family Medicine

## 2021-10-06 ENCOUNTER — Encounter: Payer: Self-pay | Admitting: Family Medicine

## 2021-10-06 VITALS — BP 124/86 | HR 96 | Temp 97.8°F | Wt 208.2 lb

## 2021-10-06 DIAGNOSIS — F988 Other specified behavioral and emotional disorders with onset usually occurring in childhood and adolescence: Secondary | ICD-10-CM

## 2021-10-06 DIAGNOSIS — F411 Generalized anxiety disorder: Secondary | ICD-10-CM | POA: Diagnosis not present

## 2021-10-06 MED ORDER — AMPHETAMINE-DEXTROAMPHETAMINE 20 MG PO TABS: 20.0000 mg | ORAL_TABLET | ORAL | 0 refills | Status: DC

## 2021-10-06 MED ORDER — AMPHETAMINE-DEXTROAMPHETAMINE 10 MG PO TABS: ORAL_TABLET | ORAL | 0 refills | Status: DC

## 2021-10-06 NOTE — Progress Notes (Signed)
[facility] [address] [place] [address] Dept: [phone] Dept Fax: [phone]  Office Visit  Subjective:    Patient ID: Gary Jennings, male    DOB: 1990/10/20, 31 y.o..   MRN: [id number]  Chief Complaint  Patient presents with   Medication Management    Patient states that Vyvanse doesn't work as well as Adderall. He states that he can't concentrate.     History of Present Illness:  Gary Jennings has a history of ADHD, diagnosed in childhood. He was treated with Adderall during his teen years. He went off of medication for some time in his 31s. However, he started to note impacts on his work. He had inattention/distractibility and an issue completing tasks. He notes off the medication, he has trouble concentrating and remembering tasks. He was being effectively managed on Adderall 20 mg XR q am and Adderall 5-10 mg q 2:00 pm. However, he was having difficulty with obtaining the 20 mg XR tabs due to [employer]. We switched him to Vyvanse 20 mg daily. He noted that this was not as effective for his concentration and task completion. He also feels this may have been worsening his anxiety. Since being off of the Vyvanse, his anxiety symptoms have improved.  Gary Jennings has a history of generalized anxiety. He is currently managed on vilazodone (Viibryd).    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 30 capsule 0   Vilazodone HCl (VIIBRYD) 20 MG TABS Take 1.5 tablets (30 mg total) by mouth daily. 135 tablet 3   No facility-administered medications prior to visit.   Allergies  Allergen Reactions   Lexapro [Escitalopram Oxalate]     Suicidal thoughts   Ceftin [Cefuroxime Axetil]     n/v   Wellbutrin [Bupropion]     Anxiety, crying spells     Objective:   Today's Vitals   10/06/21 1107  BP: 124/86  Pulse: 96  Temp: 97.8 F (36.6 C)  TempSrc: Temporal  SpO2: 98%  Weight: 208 lb 3.2 oz (94.4 kg)   Body mass index is 31.66 kg/m.   General: Well developed, well nourished. No acute distress. Psych: Alert and oriented. Normal mood and affect.  Health Maintenance Due  Topic Date Due   Hepatitis C Screening  Never done   INFLUENZA VACCINE  09/29/2021     Assessment & Plan:   1. Attention deficit disorder (ADD) without hyperactivity We will give a trial of taking a 20 mg IR Adderall in the morning, with the 10 mg IR at noon or pm. I will see him back in 1 month to reassess how he is doing with this approach.  - amphetamine-dextroamphetamine (ADDERALL) 20 MG tablet; Take 1 tablet (20 mg total) by mouth every morning.  Dispense: 30 tablet; Refill: 0 - amphetamine-dextroamphetamine (  ADDERALL) 10 MG tablet; Take 1 tablet (10 mg total) by mouth daily at 2 pm.  Dispense: 30 tablet; Refill: 0  2. Generalized anxiety disorder Continue Viibryd 20 mg daily.   Return in about 4 weeks (around 11/03/2021) for Reassessment.   Loyola Mast, MD

## 2021-11-14 ENCOUNTER — Other Ambulatory Visit: Payer: Self-pay | Admitting: Family Medicine

## 2021-11-14 DIAGNOSIS — F411 Generalized anxiety disorder: Secondary | ICD-10-CM

## 2021-11-16 ENCOUNTER — Other Ambulatory Visit: Payer: Self-pay | Admitting: Family Medicine

## 2021-11-16 ENCOUNTER — Telehealth: Payer: Self-pay | Admitting: Family Medicine

## 2021-11-16 DIAGNOSIS — F988 Other specified behavioral and emotional disorders with onset usually occurring in childhood and adolescence: Secondary | ICD-10-CM

## 2021-11-16 MED ORDER — AMPHETAMINE-DEXTROAMPHETAMINE 10 MG PO TABS: ORAL_TABLET | ORAL | 0 refills | Status: DC

## 2021-11-16 NOTE — Telephone Encounter (Signed)
Caller Name: pt Call back phone #: [phone]   MEDICATION(S):  Vilazodone HCl (VIIBRYD) 20 MG TABS [[id number]]   Days of Med Remaining: ran out yesterday  Has the patient contacted their pharmacy (YES/NO)? Yes, contact your PCP   Preferred Pharmacy: [facility][id number] [address], [address] AT [facility] OF [address]  [place], [place] [address]  Phone:  [phone]  Fax:  [phone]

## 2021-11-16 NOTE — Telephone Encounter (Signed)
Pt is saying he has not been able to fill his amphetamine-dextroamphetamine (ADDERALL) 10 MG tablet [[id number]] for the past two months. He has found a pharmacy that is able to fill it, [facility] Address: [address] [place], [place] [address]  Phone: [phone].

## 2021-11-16 NOTE — Telephone Encounter (Signed)
Adderall 10 mg LR 10/06/21, #30 , 0 rf LOV 10/06/21 FOV   none scheduled.    He states that he hasn't been able to get in 2 months. [facility]     Please review and advise.  Thanks. Dm/cma

## 2021-11-16 NOTE — Telephone Encounter (Signed)
Request already sent to provider to okay. Waiting on response.  Dm/cma

## 2021-11-16 NOTE — Telephone Encounter (Signed)
Refill request for  Vilazodone HCI 20 mg LR 03/16/21, #135, 3 rf LOV 10/06/21 FOV   none scheduled.    Please review and advise.  Thanks. Dm/cma

## 2021-11-18 ENCOUNTER — Other Ambulatory Visit: Payer: Self-pay | Admitting: Family Medicine

## 2021-11-18 DIAGNOSIS — F988 Other specified behavioral and emotional disorders with onset usually occurring in childhood and adolescence: Secondary | ICD-10-CM

## 2021-11-18 MED ORDER — AMPHETAMINE-DEXTROAMPHETAMINE 20 MG PO TABS: 20.0000 mg | ORAL_TABLET | ORAL | 0 refills | Status: DC

## 2021-11-18 NOTE — Telephone Encounter (Signed)
Patient notified VIA phone. Dm/cma  

## 2021-11-18 NOTE — Telephone Encounter (Signed)
Pt said his 10 mg adderol was called in but not his 20 mg. [facility] [address]

## 2021-11-18 NOTE — Addendum Note (Signed)
Addended by: Haydee Salter on: 11/18/2021 01:41 PM   Modules accepted: Orders

## 2021-11-18 NOTE — Telephone Encounter (Signed)
Refill request for  Adderall 20 mg LR 10/06/21, #30, 0 rf LOV 10/06/21 FOV  none scheduled.   Please review and advise.  Thanks. Dm/cma

## 2021-12-16 ENCOUNTER — Other Ambulatory Visit: Payer: Self-pay | Admitting: Nurse Practitioner

## 2021-12-16 ENCOUNTER — Other Ambulatory Visit: Payer: Self-pay | Admitting: Family Medicine

## 2021-12-16 DIAGNOSIS — F988 Other specified behavioral and emotional disorders with onset usually occurring in childhood and adolescence: Secondary | ICD-10-CM

## 2021-12-16 MED ORDER — AMPHETAMINE-DEXTROAMPHETAMINE 20 MG PO TABS: 20.0000 mg | ORAL_TABLET | ORAL | 0 refills | Status: DC

## 2021-12-16 MED ORDER — AMPHETAMINE-DEXTROAMPHETAMINE 10 MG PO TABS: ORAL_TABLET | ORAL | 0 refills | Status: DC

## 2021-12-16 NOTE — Telephone Encounter (Signed)
Refill request for  Adderall 10 mg  LR 11/16/21  Adderall 20 mg  LR 11/18/21 LOV 11/18/21 FOV  None scheduled.   Please review and advise.  Thanks. Dm/cma

## 2021-12-16 NOTE — Telephone Encounter (Signed)
Caller Name: Braydan Marriott Call back phone #: [phone]   MEDICATION(S):  Both Adderall prescriptions   Has the patient contacted their pharmacy (YES/NO)? 0 refills What did pharmacy advise?   [id number] [place], [place] Phone:  [phone]  Fax:  [phone]    ~~~Please advise patient/caregiver to allow 2-3 business days to process RX refills.

## 2021-12-18 ENCOUNTER — Telehealth: Payer: Self-pay | Admitting: Family Medicine

## 2021-12-18 DIAGNOSIS — F988 Other specified behavioral and emotional disorders with onset usually occurring in childhood and adolescence: Secondary | ICD-10-CM

## 2021-12-18 NOTE — Telephone Encounter (Signed)
Caller Name: Duwane Call back phone #: [phone]   MEDICATION(S):  amphetamine-dextroamphetamine (ADDERALL) 20 MG tablet  Days of Med Remaining:   Has the patient contacted their pharmacy (YES/NO)? yes What did pharmacy advise?  Not in stock [facility]erred [id number] [place], [place] Phone:  [phone]      ~~~Please advise patient/caregiver to allow 2-3 business days to process RX refills.

## 2021-12-23 MED ORDER — AMPHETAMINE-DEXTROAMPHETAMINE 20 MG PO TABS: 20.0000 mg | ORAL_TABLET | ORAL | 0 refills | Status: DC

## 2021-12-23 NOTE — Telephone Encounter (Signed)
Can you please and thank you [facility].   He was only able to pick up the Adderall 10 mg at [facility] due to the 20 mg  they were out of stock.   Thanks.  Dm/cma

## 2021-12-23 NOTE — Telephone Encounter (Signed)
Called [facility], they now have the Adderall in stock and will fill that RX.  [facility] and [facility] the RX sent [facility].   Left detailed VM that [facility] has RX ready for him now.  Dm/cma

## 2021-12-23 NOTE — Telephone Encounter (Signed)
Caller Name: [name] Call back phone #: [phone]  Reason for Call: Pt was seen 8/08, does he need another appt to get this prescription?

## 2022-01-11 ENCOUNTER — Other Ambulatory Visit: Payer: Self-pay | Admitting: Family Medicine

## 2022-01-11 DIAGNOSIS — F988 Other specified behavioral and emotional disorders with onset usually occurring in childhood and adolescence: Secondary | ICD-10-CM

## 2022-01-11 NOTE — Telephone Encounter (Signed)
Caller Name: Jameal Razzano Call back phone #: [phone]   MEDICATION(S):  Adderral 10mg   Days of Med Remaining:   Has the patient contacted their pharmacy (YES/NO)? yes What did pharmacy advise?   Preferred Pharmacy:  [facility] #[id number] - [place],  - [address] AT [address] OF [address]   Phone:[phone]:[phone]  ~~~Please advise patient/caregiver to allow 2-3 business days to process RX refills.

## 2022-01-11 NOTE — Telephone Encounter (Signed)
Patient notified And scheduled an appointment on 11/30/ @ 10:40 am.  Dm/cma

## 2022-01-11 NOTE — Telephone Encounter (Signed)
Refill request for  Adderall 10 mg LR 12/16/21, #30, o rf LOV 10/06/21 FOV none scheduled.   Please review and advise.  Thanks. Dm/cma

## 2022-01-18 ENCOUNTER — Telehealth: Payer: Self-pay | Admitting: Family Medicine

## 2022-01-18 MED ORDER — AMPHETAMINE-DEXTROAMPHETAMINE 10 MG PO TABS: ORAL_TABLET | ORAL | 0 refills | Status: DC

## 2022-01-18 MED ORDER — AMPHETAMINE-DEXTROAMPHETAMINE 20 MG PO TABS: 20.0000 mg | ORAL_TABLET | ORAL | 0 refills | Status: DC

## 2022-01-18 NOTE — Telephone Encounter (Signed)
Lft detailed VM that a 10 day supply was sent to the pharmacy.  Dm/cma

## 2022-01-18 NOTE — Addendum Note (Signed)
Addended by: Loyola Mast on: 01/18/2022 01:57 PM   Modules accepted: Orders

## 2022-01-18 NOTE — Telephone Encounter (Signed)
Caller Name: pt Call back phone #: [phone]   MEDICATION(S):  amphetamine-dextroamphetamine (ADDERALL) 10 MG tablet [[id number]]  and  amphetamine-dextroamphetamine (ADDERALL) 20 MG tablet [[id number]]   Days of Med Remaining:   Has the patient contacted their pharmacy (YES/NO)? no What did pharmacy advise?   Preferred Pharmacy:  [facility] #[id number] - [place], [place] - [address] AT [facility] [address] [facility] [address] [facility] [address] Phone: [phone]      ~~~Please advise patient/caregiver to allow 2-3 business days to process RX refills.

## 2022-01-18 NOTE — Telephone Encounter (Signed)
Lft detailed VM that a 10 day supply was sent to the pharmacy.  Dm/cma 

## 2022-01-28 ENCOUNTER — Encounter: Payer: Self-pay | Admitting: Family Medicine

## 2022-01-28 ENCOUNTER — Ambulatory Visit: Payer: No Typology Code available for payment source | Admitting: Family Medicine

## 2022-01-28 ENCOUNTER — Other Ambulatory Visit: Payer: Self-pay | Admitting: Family Medicine

## 2022-01-28 VITALS — BP 118/70 | HR 73 | Temp 97.6°F | Ht 68.0 in | Wt 212.4 lb

## 2022-01-28 DIAGNOSIS — F411 Generalized anxiety disorder: Secondary | ICD-10-CM

## 2022-01-28 DIAGNOSIS — R0609 Other forms of dyspnea: Secondary | ICD-10-CM

## 2022-01-28 DIAGNOSIS — F988 Other specified behavioral and emotional disorders with onset usually occurring in childhood and adolescence: Secondary | ICD-10-CM

## 2022-01-28 DIAGNOSIS — Z9189 Other specified personal risk factors, not elsewhere classified: Secondary | ICD-10-CM | POA: Diagnosis not present

## 2022-01-28 DIAGNOSIS — E6609 Other obesity due to excess calories: Secondary | ICD-10-CM

## 2022-01-28 DIAGNOSIS — Z6832 Body mass index (BMI) 32.0-32.9, adult: Secondary | ICD-10-CM

## 2022-01-28 MED ORDER — AMPHETAMINE-DEXTROAMPHETAMINE 20 MG PO TABS: 20.0000 mg | ORAL_TABLET | ORAL | 0 refills | Status: DC

## 2022-01-28 MED ORDER — VILAZODONE HCL 20 MG PO TABS: 30.0000 mg | ORAL_TABLET | Freq: Every day | ORAL | 3 refills | Status: DC

## 2022-01-28 MED ORDER — AMPHETAMINE-DEXTROAMPHETAMINE 10 MG PO TABS: ORAL_TABLET | ORAL | 0 refills | Status: DC

## 2022-01-28 MED ORDER — ALBUTEROL SULFATE HFA 108 (90 BASE) MCG/ACT IN AERS: INHALATION_SPRAY | RESPIRATORY_TRACT | 2 refills | Status: DC

## 2022-01-28 NOTE — Telephone Encounter (Signed)
Pt called and stated that you only gave him a 10 day supply of med instead of his whole month amphetamine-dextroamphetamine (ADDERALL) 10 MG tablet [[id number]]  and amphetamine-dextroamphetamine (ADDERALL) 20 MG tablet [[id number]]  please call pt

## 2022-01-28 NOTE — Progress Notes (Signed)
[facility] [address] [place] [address] Dept: [phone] Dept Fax: [phone]  Chronic Care Office Visit  Subjective:    Patient ID: Gary Jennings, male    DOB: 20-Dec-1990, 31 y.o..   MRN: [id number]  Chief Complaint  Patient presents with   Follow-up    F/u ADHD.  Also c/o having snoring issues.      History of Present Illness:  Patient is in today for reassessment of chronic medical issues.  Mr. Ormond has a history of ADHD, diagnosed in childhood. Adderall 20 mg q am and Adderall 10 mg qpm. We had switched back to this from Vyvanse. He feels this is helping to manage his symtpoms well without worsenign his anxiety.   Mr. Filter has a history of generalized anxiety. He is currently managed on vilazodone  (Viibryd) 30 mg daily.    Mr. Missildine notes that his weight has been climbing. He reengaged with exercise and has had ~ 8 lbs of weight loss more recently. He is taking some exercise classes. He notes that he seems to get short of breath more quickly than others in his class. He describes his lungs feeling as if they are on fire and that he may have some tightness. He denies any childhood history of asthma.  Mr. Sigmund notes that he has an issue with snoring. He states his girlfriend wakes him up several times a night to get him to stop snoring. She has not told him that he stops breathing in his sleep. He occasionally has non-restorative sleep, but denies any daytime hypersomnolence. His father has sleep apnea and uses a CPAP machine. Mr. Nancarrow feels like his weight gain has contributed.  Past Medical History: Patient Active Problem List   Diagnosis Date Noted   At risk for sleep apnea 01/28/2022   Class 1 obesity due to excess calories without serious comorbidity with body mass index (BMI) of 32.0 to 32.9 in adult 01/28/2022   Penile lesion 07/16/2021   Tinea capitis 07/16/2021   Patellar tendinitis, left knee  06/23/2020   Loose body in knee, right knee 06/23/2020   Generalized anxiety disorder 09/06/2016   GERD 01/03/2008   Attention deficit disorder 04/17/2007   History reviewed. No pertinent surgical history.  Family History  Problem Relation Age of Onset   COPD Father    Stroke Maternal Grandfather    Heart disease Maternal Grandfather    Stroke Paternal Grandfather    Outpatient Medications Prior to Visit  Medication Sig Dispense Refill   amphetamine-dextroamphetamine (ADDERALL) 10 MG tablet Take 1 tablet (10 mg total) by mouth daily at 2 pm. 10 tablet 0   amphetamine-dextroamphetamine (ADDERALL) 20 MG tablet Take 1 tablet (20 mg total) by mouth every morning. 10 tablet 0   Vilazodone HCl 20 MG TABS TAKE ONE AND A HALF TABLETS BY MOUTH DAILY 135 tablet 3   No facility-administered medications prior to visit.   Allergies  Allergen Reactions   Lexapro [Escitalopram Oxalate]     Suicidal thoughts   Ceftin [Cefuroxime Axetil]     n/v   Wellbutrin [Bupropion]     Anxiety, crying spells     Objective:   Today's Vitals   01/28/22 1043  BP: 118/70  Pulse: 73  Temp: 97.6 F (36.4 C)  TempSrc: Temporal  SpO2: 98%  Weight: 212 lb 6.4 oz (96.3 kg)  Height: 5\' 8"  (1.727 m)   Body mass index is 32.3 kg/m.   General: Well  developed, well nourished. No acute distress. Lungs: Clear to auscultation bilaterally. No wheezing, rales or rhonchi. Psych: Alert and oriented. Normal mood and affect.  Health Maintenance Due  Topic Date Due   Hepatitis C Screening  Never done   STOP-Bang Score for Sleep Apnea Screening  Patient Self-Reported Questions         Score Do you snore loudly? (louder than  1  talking or sufficiently loud to be  heard through doors)  Do you feel often feel tired, fatigued, or 0  sleepy during the daytime?  Has anyone observed you stop breathing 0  during sleep?  Do you have (or are you being treated for) 0  high blood pressure?  Clinical  Information          Score BMI>35 kg/m2     0  Age > 50 years    0  Neck circumference > 40 cm   1  Gender (male)     1   Total      3  A score <3 indicates a low risk of sleep apnea.    Assessment & Plan:   1. Attention deficit disorder (ADD) without hyperactivity Appears to be effectively managed on his current regimen.  - amphetamine-dextroamphetamine (ADDERALL) 10 MG tablet; Take 1 tablet (10 mg total) by mouth daily at 2 pm.  Dispense: 10 tablet; Refill: 0 - amphetamine-dextroamphetamine (ADDERALL) 20 MG tablet; Take 1 tablet (20 mg total) by mouth every morning.  Dispense: 10 tablet; Refill: 0  2. Generalized anxiety disorder Improved with stopping Vyvanse. Continue Viibryd.  - Vilazodone HCl 20 MG TABS; Take 1.5 tablets (30 mg total) by mouth daily.  Dispense: 135 tablet; Refill: 3  3. At risk for sleep apnea I offered a referral for a sleep study. Mr. Kau would prefer to keep working at his weight loss. I agree that this could make a difference in his snoring.  4. Dyspnea on exertion The breathlessness could be due to his weight gain and physical deconditioning. I would also consider potential EIB. I recommend we give a trial of premedicating prior to exercise with albuterol to see if this improves for him. If this is not effective, I would consider a pulmonology referral to assess further.  - albuterol (VENTOLIN HFA) 108 (90 Base) MCG/ACT inhaler; Inhale 2 puffs into the lungs 15 min. prior to exercise  Dispense: 8 g; Refill: 2  5. Class 1 obesity due to excess calories without serious comorbidity with body mass index (BMI) of 32.0 to 32.9 in adult We discussed the importance of him for getting his weight down. He asked about injectable medications for this. At the current time, I urge him to continue to focus on exercise and improved diet.  Return in about 3 months (around 04/29/2022) for Reassessment.   Loyola Mast, MD

## 2022-01-28 NOTE — Telephone Encounter (Signed)
Unable to leave message.  Will try to call him back later.    Please advise.   Thanks. Dm/cma

## 2022-01-29 NOTE — Telephone Encounter (Signed)
Patient notified VIA phone. Dm/cma  

## 2022-02-07 ENCOUNTER — Other Ambulatory Visit: Payer: Self-pay | Admitting: Family Medicine

## 2022-02-07 DIAGNOSIS — F411 Generalized anxiety disorder: Secondary | ICD-10-CM

## 2022-03-08 ENCOUNTER — Other Ambulatory Visit: Payer: Self-pay | Admitting: Family Medicine

## 2022-03-08 DIAGNOSIS — F988 Other specified behavioral and emotional disorders with onset usually occurring in childhood and adolescence: Secondary | ICD-10-CM

## 2022-03-08 MED ORDER — AMPHETAMINE-DEXTROAMPHETAMINE 10 MG PO TABS: ORAL_TABLET | ORAL | 0 refills | Status: DC

## 2022-03-08 MED ORDER — AMPHETAMINE-DEXTROAMPHETAMINE 20 MG PO TABS: 20.0000 mg | ORAL_TABLET | ORAL | 0 refills | Status: DC

## 2022-03-08 NOTE — Telephone Encounter (Signed)
Lft detailed VM that prescriptions have been sent to the pharmacy.  Dm/cma

## 2022-03-08 NOTE — Telephone Encounter (Signed)
Refill request for  Adderall 10 mg LR 01/28/22, #30, 0 rf  Adderall 20 mg LR  01/28/22/, #30, 0 rf LOV 01/28/22 FOV   none scheduled.   Please review and advise.  Thanks. Dm/cma

## 2022-03-08 NOTE — Telephone Encounter (Signed)
Caller Name: pt Call back phone #: [phone]   MEDICATION(S):  amphetamine-dextroamphetamine (ADDERALL) 20 MG tablet [[id number]]   Days of Med Remaining:   Has the patient contacted their pharmacy (YES/NO)? no What did pharmacy advise?   Preferred Pharmacy:  [facility] #[id number] [address], [address] AT [facility] [address] [place] [address] [facility] [address] [place], [place] [address] Phone: [phone]  Fax: [phone]    ~~~Please advise patient/caregiver to allow 2-3 business days to process RX refills. .please call the patient also

## 2022-03-11 ENCOUNTER — Other Ambulatory Visit: Payer: Self-pay | Admitting: Family Medicine

## 2022-03-11 DIAGNOSIS — F988 Other specified behavioral and emotional disorders with onset usually occurring in childhood and adolescence: Secondary | ICD-10-CM

## 2022-03-11 NOTE — Telephone Encounter (Signed)
Pt called and stated when hen went to pick up  amphetamine-dextroamphetamine (ADDERALL) 10 MG tablet 30 tablet   It was not in stock. Pt said please give him a call

## 2022-03-12 MED ORDER — AMPHETAMINE-DEXTROAMPHETAMINE 10 MG PO TABS: ORAL_TABLET | ORAL | 0 refills | Status: DC

## 2022-03-12 NOTE — Telephone Encounter (Signed)
Spoke to patient [facility] is out of the Adderall 10 mg.  Can you [facility] and [facility]  I will call and cancel RX at [facility]/  Dm/cma

## 2022-04-08 NOTE — Telephone Encounter (Signed)
Caller Name: Gary Jennings Call back phone #: [phone]  Reason for Call: pt called about prior auth needed for both doses of Adderall. 2 different pharmacies due to stocking issues.  [facility] please provide status update  Adderall 20mg  (from [facility]) please provide status update

## 2022-04-12 ENCOUNTER — Other Ambulatory Visit: Payer: Self-pay | Admitting: Family Medicine

## 2022-04-12 DIAGNOSIS — F988 Other specified behavioral and emotional disorders with onset usually occurring in childhood and adolescence: Secondary | ICD-10-CM

## 2022-04-12 MED ORDER — AMPHETAMINE-DEXTROAMPHETAMINE 10 MG PO TABS: ORAL_TABLET | ORAL | 0 refills | Status: DC

## 2022-04-12 NOTE — Telephone Encounter (Signed)
Prescription Request  04/12/2022  Is this a "Controlled Substance" medicine? No  LOV: 01/28/2022  What is the name of the medication or equipment? amphetamine-dextroamphetamine (ADDERALL) 10 MG tablet   Have you contacted your pharmacy to request a refill? No   Which pharmacy would you like this sent to?  [id number] [place] [id number]: [phone] Fax: [phone]    Patient notified that their request is being sent to the clinical staff for review and that they should receive a response within 2 business days.   Please advise at Mobile [phone](mobile)

## 2022-04-12 NOTE — Telephone Encounter (Deleted)
Current Outpatient Medications on File Prior to Visit  Medication Sig Dispense Refill   albuterol (VENTOLIN HFA) 108 (90 Base) MCG/ACT inhaler Inhale 2 puffs into the lungs 15 min. prior to exercise 8 g 2   amphetamine-dextroamphetamine (ADDERALL) 10 MG tablet Take 1 tablet (10 mg total) by mouth daily at 2 pm. 30 tablet 0   amphetamine-dextroamphetamine (ADDERALL) 20 MG tablet Take 1 tablet (20 mg total) by mouth every morning. 30 tablet 0   Vilazodone HCl 20 MG TABS Take 1.5 tablets (30 mg total) by mouth daily. 135 tablet 3   No current facility-administered medications on file prior to visit.

## 2022-04-12 NOTE — Telephone Encounter (Signed)
Refill request for  Adderall 10 mg LR 03/12/22, #30, 0 RF LOV 01/28/22 FOV   none scheduled.  Please review and advise.  Thanks. Dm/cma

## 2022-04-13 ENCOUNTER — Other Ambulatory Visit (HOSPITAL_COMMUNITY): Payer: Self-pay

## 2022-04-13 ENCOUNTER — Telehealth: Payer: Self-pay | Admitting: Family Medicine

## 2022-04-13 DIAGNOSIS — F411 Generalized anxiety disorder: Secondary | ICD-10-CM

## 2022-04-13 NOTE — Telephone Encounter (Signed)
Placed a call to [phone] to start a prior authorization by phone for the patient. Representative is faxing over the forms to be filled out.

## 2022-04-13 NOTE — Telephone Encounter (Signed)
Patient called and said that he needs a Prior Auth done for his Vilazodone HCl 20 MG TABS for the year.  Please advise

## 2022-04-15 ENCOUNTER — Other Ambulatory Visit: Payer: Self-pay

## 2022-04-15 ENCOUNTER — Other Ambulatory Visit (HOSPITAL_COMMUNITY): Payer: Self-pay

## 2022-04-15 NOTE — Telephone Encounter (Signed)
Pharmacy Patient Advocate Encounter   Received notification that prior authorization for Vilazodone 2m is required/requested.  Per Test Claim: Member's plan has a per day limit - quantity 30 for 30 days, clinical review   PA submitted on 04/15/22 to (ins) [employer] via fax at [phone] Phone number: [phone] Status is pending

## 2022-04-19 ENCOUNTER — Other Ambulatory Visit: Payer: Self-pay | Admitting: Family Medicine

## 2022-04-19 DIAGNOSIS — F988 Other specified behavioral and emotional disorders with onset usually occurring in childhood and adolescence: Secondary | ICD-10-CM

## 2022-04-19 MED ORDER — VILAZODONE HCL 20 MG PO TABS: 30.0000 mg | ORAL_TABLET | Freq: Every day | ORAL | 11 refills | Status: DC

## 2022-04-19 NOTE — Telephone Encounter (Signed)
Prescription Request  04/19/2022  Is this a "Controlled Substance" medicine? Yes  LOV: 01/28/2022  What is the name of the medication or equipment? amphetamine-dextroamphetamine (ADDERALL) 20 MG tablet   Have you contacted your pharmacy to request a refill? Yes   Which pharmacy would you like this sent to?  Patient notified that their request is being sent to the clinical staff for review and that they should receive a response within 2 business days.   Please advise at Mobile [phone] (mobile)

## 2022-04-19 NOTE — Addendum Note (Signed)
Addended by: Haydee Salter on: 04/19/2022 05:31 PM   Modules accepted: Orders

## 2022-04-20 MED ORDER — AMPHETAMINE-DEXTROAMPHETAMINE 20 MG PO TABS: 20.0000 mg | ORAL_TABLET | ORAL | 0 refills | Status: DC

## 2022-04-20 NOTE — Telephone Encounter (Signed)
Refill request for  Adderall 20 mg LR  03/08/22, #30, 0 rf LOV 01/28/22 FOV  none scheduled.   Please review and advise  Thanks.  Dm/cma

## 2022-04-21 ENCOUNTER — Other Ambulatory Visit (HOSPITAL_COMMUNITY): Payer: Self-pay

## 2022-04-22 NOTE — Telephone Encounter (Signed)
Spoke to pharmacy and he was able to pick up a 30 days supply on 04/15/22.  Dm/cma

## 2022-04-26 ENCOUNTER — Other Ambulatory Visit (HOSPITAL_COMMUNITY): Payer: Self-pay

## 2022-04-26 ENCOUNTER — Telehealth: Payer: Self-pay | Admitting: Family Medicine

## 2022-04-26 NOTE — Telephone Encounter (Signed)
Pt said pharmacy said his addoral 20 mg had not been called in. I shared the info that it had and he is going to call the pharmacy back.

## 2022-04-27 ENCOUNTER — Other Ambulatory Visit (HOSPITAL_COMMUNITY): Payer: Self-pay

## 2022-04-27 NOTE — Telephone Encounter (Signed)
PA is needed on the RX.   Lft VM letting patient know.  Dm/cma

## 2022-05-04 ENCOUNTER — Other Ambulatory Visit (HOSPITAL_COMMUNITY): Payer: Self-pay

## 2022-05-04 NOTE — Telephone Encounter (Signed)
Not really sure?  I haven't spoke to the pharmacy.  Dm/cma

## 2022-05-04 NOTE — Telephone Encounter (Signed)
Does pt need brand specific due to inventory issue? Happy to submit a PA, just want to be sure that if approved, pt will be able to find medication. Please advise. Thank you.

## 2022-05-11 ENCOUNTER — Telehealth: Payer: Self-pay | Admitting: Family Medicine

## 2022-05-11 DIAGNOSIS — F988 Other specified behavioral and emotional disorders with onset usually occurring in childhood and adolescence: Secondary | ICD-10-CM

## 2022-05-11 MED ORDER — AMPHETAMINE-DEXTROAMPHETAMINE 10 MG PO TABS: ORAL_TABLET | ORAL | 0 refills | Status: DC

## 2022-05-11 NOTE — Telephone Encounter (Signed)
Pt called stating medication was sent to the wrong pharmacy. [facility] res[facility]   [id number] [phone] Fax: [phone]

## 2022-05-11 NOTE — Addendum Note (Signed)
Addended by: Haydee Salter on: 05/11/2022 03:40 PM   Modules accepted: Orders

## 2022-05-11 NOTE — Telephone Encounter (Signed)
Refill request for  Adderall  10 mg LR 04/12/22, #30, 0 rf LOV  03/30/21 FOV    none scheduled    Please review and advise.  Thanks.  Dm/cma

## 2022-05-11 NOTE — Telephone Encounter (Signed)
Caller Name: Gerome Call back phone #: [phone]   MEDICATION(S):  Adoral 10 mg    Days of Med Remaining: a few  [facility] [address]

## 2022-05-14 ENCOUNTER — Telehealth: Payer: Self-pay

## 2022-05-14 NOTE — Telephone Encounter (Signed)
Can you please and thank you call and do a PA for patients Adderall 10 mg.  Thanks. Dm/cma

## 2022-05-17 ENCOUNTER — Other Ambulatory Visit (HOSPITAL_COMMUNITY): Payer: Self-pay

## 2022-05-17 ENCOUNTER — Telehealth: Payer: Self-pay

## 2022-05-17 NOTE — Telephone Encounter (Signed)
Pharmacy Patient Advocate Encounter   Received notification that prior authorization for Adderall 10mg  tab (generic) is required/requested.  Per Test Claim: Prior authorization required   PA submitted on 05/17/22 to (ins) [employer] via fax at [phone]  Status is pending

## 2022-05-18 ENCOUNTER — Other Ambulatory Visit (HOSPITAL_COMMUNITY): Payer: Self-pay

## 2022-05-19 NOTE — Telephone Encounter (Signed)
Prior Authorization for Amphetamine-Dextroamphetamine has been approved by [employer] (ins).     PA # N/A Effective dates: 05/17/2022 through 05/17/2023

## 2022-05-19 NOTE — Telephone Encounter (Signed)
Pharmacy Patient Advocate Encounter  Prior Authorization for Amphetamine-Dextroamphetamine has been approved by [facility] (ins).    PA # N/A Effective dates: 05/17/2022 through 05/17/2023

## 2022-05-24 ENCOUNTER — Other Ambulatory Visit: Payer: Self-pay | Admitting: Family Medicine

## 2022-05-24 DIAGNOSIS — F988 Other specified behavioral and emotional disorders with onset usually occurring in childhood and adolescence: Secondary | ICD-10-CM

## 2022-05-24 MED ORDER — AMPHETAMINE-DEXTROAMPHETAMINE 20 MG PO TABS: 20.0000 mg | ORAL_TABLET | ORAL | 0 refills | Status: DC

## 2022-05-24 NOTE — Telephone Encounter (Signed)
Prescription Request  05/24/2022  LOV: 01/28/2022  What is the name of the medication or equipment? amphetamine-dextroamphetamine (ADDERALL) 20 MG tablet [id number]   Have you contacted your pharmacy to request a refill? No   Which pharmacy would you like this sent to?  [id number] [address], [address] AT [facility] [address] & [address] [place] [address] Phone: [phone] Fax: [phone]      Patient notified that their request is being sent to the clinical staff for review and that they should receive a response within 2 business days.   Please advise at Mobile [phone] (mobile)

## 2022-05-24 NOTE — Telephone Encounter (Signed)
Refill request for  Adderall 20 mg LR  04/20/22, #30, 0 rf LOV 01/28/22 FOV  none scheduled.    Please review and advise.  Thanks. Dm/cma

## 2022-05-24 NOTE — Addendum Note (Signed)
Addended by: Konrad Saha on: 05/24/2022 01:47 PM   Modules accepted: Orders

## 2022-05-25 NOTE — Telephone Encounter (Signed)
Lft VM  that RX was sent to the pharmacy. Dm/cma  

## 2022-06-10 ENCOUNTER — Telehealth: Payer: Self-pay | Admitting: Family Medicine

## 2022-06-10 NOTE — Telephone Encounter (Signed)
LR 05/11/22, #30, 0 rf LOV 01/28/22 FOV  none scheduled.  Over due for an appointment.   Appointment scheduled for 06/11/22 @ 3:00 pm.  Patient agreeable.  Dm/cma

## 2022-06-10 NOTE — Telephone Encounter (Signed)
Caller Name: Gary Jennings Call back phone #: [phone]   MEDICATION(S):  Addorall 10 mg  Days of Med Remaining: 0  Has the patient contacted their pharmacy (YES/NO)? no What did pharmacy advise?   Preferred Pharmacy:  [facility]

## 2022-06-11 ENCOUNTER — Encounter: Payer: Self-pay | Admitting: Family Medicine

## 2022-06-11 ENCOUNTER — Ambulatory Visit: Payer: No Typology Code available for payment source | Admitting: Family Medicine

## 2022-06-11 VITALS — BP 120/70 | HR 68 | Temp 97.6°F | Ht 68.0 in | Wt 209.3 lb

## 2022-06-11 DIAGNOSIS — F411 Generalized anxiety disorder: Secondary | ICD-10-CM | POA: Diagnosis not present

## 2022-06-11 DIAGNOSIS — F988 Other specified behavioral and emotional disorders with onset usually occurring in childhood and adolescence: Secondary | ICD-10-CM | POA: Diagnosis not present

## 2022-06-11 MED ORDER — AMPHETAMINE-DEXTROAMPHETAMINE 20 MG PO TABS: 20.0000 mg | ORAL_TABLET | ORAL | 0 refills | Status: DC

## 2022-06-11 MED ORDER — AMPHETAMINE-DEXTROAMPHETAMINE 10 MG PO TABS: ORAL_TABLET | ORAL | 0 refills | Status: DC

## 2022-06-11 NOTE — Assessment & Plan Note (Signed)
We discussed a trial off of vilazodone. I recommend he taper to 20 mg daily for 1 week, then 10 mg daily for 1 week, then stop. I will see him back in 6 weeks to reassess how he is doing.

## 2022-06-11 NOTE — Assessment & Plan Note (Signed)
We will continue Adderall 20 mg q am and Adderall 10 mg qpm for now. Reassess need to increase the dose at his next visit.

## 2022-06-11 NOTE — Progress Notes (Signed)
[facility] [address] [place] [address] Dept: [phone] Dept Fax: [phone]  Chronic Care Office Visit  Subjective:    Patient ID: Gary Jennings, male    DOB: 1990/11/03, 32 y.o..   MRN: [id number]  Chief Complaint  Patient presents with   Medical Management of Chronic Issues    Fu ADHD/meds.    History of Present Illness:  Patient is in today for reassessment of chronic medical issues.  Gary Jennings has a history of ADHD, diagnosed in childhood. He is managed on Adderall 20 mg q am and Adderall 10 mg qpm. He feels that this does manage his focus at work well overall, though he wonders about whether he may need a higher dose. He prefers to use the immediate release formulations so this does not linger in his system at night.   Gary Jennings has a history of generalized anxiety. He is currently managed on vilazodone  (Viibryd) 30 mg daily.  He does not like the emotional blunting that he seems to get form this. he feels his mood is much better. He wonders about the need to continue this.  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 No pertinent surgical history. Family History  Problem Relation Age of Onset   COPD Father    Stroke Maternal Grandfather    Heart disease Maternal Grandfather    Stroke Paternal Grandfather    Outpatient Medications Prior to Visit  Medication Sig Dispense Refill   albuterol (VENTOLIN HFA) 108 (90 Base) MCG/ACT inhaler Inhale 2  puffs into the lungs 15 min. prior to exercise 8 g 2   amphetamine-dextroamphetamine (ADDERALL) 10 MG tablet Take 1 tablet (10 mg total) by mouth daily at 2 pm. 30 tablet 0   amphetamine-dextroamphetamine (ADDERALL) 20 MG tablet Take 1 tablet (20 mg total) by mouth every morning. 30 tablet 0   Vilazodone HCl 20 MG TABS Take 1.5 tablets (30 mg total) by mouth daily. 45 tablet 11   No facility-administered medications prior to visit.   Allergies  Allergen Reactions   Lexapro [Escitalopram Oxalate]     Suicidal thoughts   Ceftin [Cefuroxime Axetil]     n/v   Wellbutrin [Bupropion]     Anxiety, crying spells   Objective:   Today's Vitals   06/11/22 1504  BP: 120/70  Pulse: 68  Temp: 97.6 F (36.4 C)  TempSrc: Temporal  SpO2: 97%  Weight: 209 lb 4.8 oz (94.9 kg)  Height:  (1.727 m)   Body mass index is 31.82 kg/m.   General: Well developed, well nourished. No acute distress. Psych: Alert and oriented. Normal mood and affect.  Health Maintenance Due  Topic Date Due   Hepatitis C Screening  Never done      Assessment & Plan:   Problem List Items Addressed This Visit       Other   Attention deficit disorder - Primary   Generalized anxiety disorder    Return in about 6 months (around  12/11/2022).   Loyola Mast, MD

## 2022-06-14 ENCOUNTER — Encounter: Payer: Self-pay | Admitting: *Deleted

## 2022-06-22 ENCOUNTER — Telehealth: Payer: Self-pay | Admitting: Family Medicine

## 2022-06-22 DIAGNOSIS — F988 Other specified behavioral and emotional disorders with onset usually occurring in childhood and adolescence: Secondary | ICD-10-CM

## 2022-06-22 DIAGNOSIS — R0609 Other forms of dyspnea: Secondary | ICD-10-CM

## 2022-06-22 NOTE — Telephone Encounter (Signed)
Caller Name: Stacie Call back phone #: [phone]   MEDICATION(S):  amphetamine-dextroamphetamine (ADDERALL) 20 MG tablet   Days of Med Remaining: 7  Preferred Pharmacy:  [facility] [place]

## 2022-06-23 MED ORDER — AMPHETAMINE-DEXTROAMPHETAMINE 20 MG PO TABS: 20.0000 mg | ORAL_TABLET | ORAL | 0 refills | Status: DC

## 2022-06-23 MED ORDER — AMPHETAMINE-DEXTROAMPHETAMINE 10 MG PO TABS: ORAL_TABLET | ORAL | 0 refills | Status: DC

## 2022-06-23 NOTE — Telephone Encounter (Signed)
Refill request for  Adderall 10 mg LR 06/11/22, #30, 0 rf  Adderall 20 mg LR  06/11/22, #30, 0 rf LOV  06/11/22 FOV  none scheduled.   Please review and advise.   Thanks. Dm/cma

## 2022-06-29 ENCOUNTER — Ambulatory Visit: Payer: No Typology Code available for payment source | Admitting: Family Medicine

## 2022-06-29 ENCOUNTER — Encounter: Payer: Self-pay | Admitting: Family Medicine

## 2022-06-29 VITALS — BP 106/70 | HR 53 | Temp 98.2°F | Ht 68.0 in | Wt 206.8 lb

## 2022-06-29 DIAGNOSIS — F988 Other specified behavioral and emotional disorders with onset usually occurring in childhood and adolescence: Secondary | ICD-10-CM | POA: Diagnosis not present

## 2022-06-29 DIAGNOSIS — F411 Generalized anxiety disorder: Secondary | ICD-10-CM

## 2022-06-29 MED ORDER — AMPHETAMINE-DEXTROAMPHETAMINE 20 MG PO TABS: 20.0000 mg | ORAL_TABLET | ORAL | 0 refills | Status: DC

## 2022-06-29 NOTE — Assessment & Plan Note (Signed)
I recommend we give him a few weeks to adjust to being off of his vilazodone. If when he returns form his fishing tirp, his focus remains poor at work, I would consider a dosage increase. For now, we will continue Adderall 20 mg q am and Adderall 10 mg qpm.

## 2022-06-29 NOTE — Assessment & Plan Note (Signed)
Gary Jennings has successfully tapered off of vilazodone. We will monitor his anxiety for now.

## 2022-06-29 NOTE — Progress Notes (Signed)
[facility] [address] [place] [address] Dept: [phone] Dept Fax: [phone]  Chronic Care Office Visit  Subjective:    Patient ID: Gary Jennings, male    DOB: 1990/11/09, 32 y.o..   MRN: [id number]  Chief Complaint  Patient presents with   Medication Consultation   History of Present Illness:  Patient is in today for reassessment of chronic medical issues.  Mr. Gary Jennings has a history of generalized anxiety. At his last visit, we discussed tapering off of his vilazodone  (Viibryd).  He did not like the emotional blunting that he seemed to get from this. He has now completed his taper. He notes he did experience headaches during his taper, but today this is a bit better.  Mr. Gary Jennings has a history of ADHD, diagnosed in childhood. He is managed on Adderall 20 mg q am and Adderall 10 mg qpm. Since stopping the Viibryd, he is finding his focus seems to be decreased. He has found work to be more difficult. He is getting ready to be off work for 3 weeks (annual fishing trip).   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 No pertinent surgical history. Family History  Problem Relation Age of Onset   COPD Father    Stroke Maternal Grandfather    Heart disease Maternal Grandfather    Stroke Paternal Grandfather    Outpatient Medications Prior to Visit  Medication Sig Dispense Refill   albuterol (VENTOLIN HFA) 108 (90 Base) MCG/ACT inhaler Inhale 2 puffs into the lungs  15 min. prior to exercise 8 g 2   amphetamine-dextroamphetamine (ADDERALL) 10 MG tablet Take 1 tablet (10 mg total) by mouth daily at 2 pm. 30 tablet 0   Vilazodone HCl 20 MG TABS Take 1.5 tablets (30 mg total) by mouth daily. 45 tablet 11   amphetamine-dextroamphetamine (ADDERALL) 20 MG tablet Take 1 tablet (20 mg total) by mouth every morning. 30 tablet 0   No facility-administered medications prior to visit.   Allergies  Allergen Reactions   Lexapro [Escitalopram Oxalate]     Suicidal thoughts   Ceftin [Cefuroxime Axetil]     n/v   Wellbutrin [Bupropion]     Anxiety, crying spells   Objective:   Today's Vitals   06/29/22 1107  BP: 106/70  Pulse: (!) 53  Temp: 98.2 F (36.8 C)  TempSrc: Oral  SpO2: 97%  Weight: 206 lb 12.8 oz (93.8 kg)  Height: 5\' 8"  (1.727 m)   Body mass index is 31.44 kg/m.   General: Well developed, well nourished. No acute distress. Psych: Alert and oriented. Normal mood and affect.  Health Maintenance Due  Topic Date Due   Hepatitis C Screening  Never done     Assessment & Plan:   Problem List Items Addressed This Visit       Other   Attention deficit disorder - Primary    I recommend we give him a few weeks to adjust to being off of his vilazodone. If  when he returns form his fishing tirp, his focus remains poor at work, I would consider a dosage increase. For now, we will continue Adderall 20 mg q am and Adderall 10 mg qpm.      Relevant Medications   amphetamine-dextroamphetamine (ADDERALL) 20 MG tablet   Generalized anxiety disorder    Mr. Gary Jennings has successfully tapered off of vilazodone. We will monitor his anxiety for now.       Return in about 3 months (around 09/28/2022) for Reassessment.   Loyola Mast, MD

## 2022-07-30 ENCOUNTER — Other Ambulatory Visit: Payer: Self-pay | Admitting: Family Medicine

## 2022-07-30 DIAGNOSIS — F988 Other specified behavioral and emotional disorders with onset usually occurring in childhood and adolescence: Secondary | ICD-10-CM

## 2022-07-30 NOTE — Telephone Encounter (Signed)
   Pt needs this refilled amphetamine-dextroamphetamine (ADDERALL) 20 MG tablet [[id number]]  [facility] on [address]

## 2022-08-02 MED ORDER — AMPHETAMINE-DEXTROAMPHETAMINE 20 MG PO TABS: 20.0000 mg | ORAL_TABLET | ORAL | 0 refills | Status: DC

## 2022-08-02 MED ORDER — AMPHETAMINE-DEXTROAMPHETAMINE 10 MG PO TABS: ORAL_TABLET | ORAL | 0 refills | Status: DC

## 2022-08-02 NOTE — Telephone Encounter (Signed)
Lft VM  that Rx was sent to the pharmacy.  Dm/cma

## 2022-08-02 NOTE — Telephone Encounter (Signed)
Refill request for  Adderall 20 mg LR 06/29/22, #30, 0 rf  Adderall 10 mg LR  06/23/22, #30, 0 rf LOV  06/29/22 FOV  none scheduled.   Please review and advise.  Thanks. Dm/cma

## 2022-08-17 ENCOUNTER — Telehealth: Payer: Self-pay | Admitting: Family Medicine

## 2022-08-17 DIAGNOSIS — F988 Other specified behavioral and emotional disorders with onset usually occurring in childhood and adolescence: Secondary | ICD-10-CM

## 2022-08-17 MED ORDER — AMPHETAMINE-DEXTROAMPHETAMINE 10 MG PO TABS: ORAL_TABLET | ORAL | 0 refills | Status: DC

## 2022-08-17 NOTE — Telephone Encounter (Signed)
Pt requesting Adderall refill. Has 3 pills left.

## 2022-08-17 NOTE — Telephone Encounter (Signed)
Prescription Request  08/17/2022  LOV: 06/29/2022  What is the name of the medication or equipment? amphetamine-dextroamphetamine (ADDERALL) 10 MG tablet [[id number]]. He got this one filled on 07/11/22 not 08/02/22. He had trouble getting it last time. He has 3 pills left  Have you contacted your pharmacy to request a refill? No   Which pharmacy would you like this sent to?    [id number] [place] [address], [place] - [address] [place] [address] Phone: [phone] Fax: [phone]    Patient notified that their request is being sent to the clinical staff for review and that they should receive a response within 2 business days.   Please advise at [facility] [phone]

## 2022-08-17 NOTE — Addendum Note (Signed)
Addended by: Loyola Mast on: 08/17/2022 04:39 PM   Modules accepted: Orders

## 2022-09-07 ENCOUNTER — Other Ambulatory Visit: Payer: Self-pay | Admitting: Family Medicine

## 2022-09-07 DIAGNOSIS — F988 Other specified behavioral and emotional disorders with onset usually occurring in childhood and adolescence: Secondary | ICD-10-CM

## 2022-09-07 MED ORDER — AMPHETAMINE-DEXTROAMPHETAMINE 20 MG PO TABS: 20.0000 mg | ORAL_TABLET | ORAL | 0 refills | Status: DC

## 2022-09-07 MED ORDER — AMPHETAMINE-DEXTROAMPHETAMINE 10 MG PO TABS
ORAL_TABLET | ORAL | 0 refills | Status: DC
Start: 2022-09-07 — End: 2022-10-18

## 2022-09-07 NOTE — Telephone Encounter (Signed)
Refill request for  Adderall 20 mg LR 08/02/22, #30, 0 rf  Adderall 10 mg LR  08/17/22, #30, 0 rf LOV 06/29/22 FOV  none scheduled.    Please review and advise.  Thanks. Dm/cma

## 2022-09-07 NOTE — Telephone Encounter (Signed)
Prescription Request  09/07/2022  LOV: 06/29/2022  What is the name of the medication or equipment? amphetamine-dextroamphetamine amphetamine-dextroamphetamine (ADDERALL) 20 MG tablet  Have you contacted your pharmacy to request a refill? No   Which pharmacy would you like this sent to?  [facility][id number] [place] [address], [place] - [address] AT [facility] [address] & [address] [place] [address] Phone: [phone] Fax: [phone]   Patient notified that their request is being sent to the clinical staff for review and that they should receive a response within 2 business days.   Please advise at Mobile [phone] (mobile)

## 2022-09-07 NOTE — Telephone Encounter (Signed)
Lft detailed VM that RX was sent to the pharmacy. Dm/cma  

## 2022-09-21 ENCOUNTER — Telehealth: Payer: Self-pay | Admitting: Family Medicine

## 2022-09-21 NOTE — Telephone Encounter (Signed)
Prescription Request  09/21/2022  LOV: 06/29/2022  What is the name of the medication or equipment? amphetamine-dextroamphetamine (ADDERALL) 10 MG tablet [[id number]]   Order Details   Have you contacted your pharmacy to request a refill? No   Which pharmacy would you like this sent to?  [facility] #[id number] [place] [address], [place] - [address] AT [facility] [address] & [address] [place] [address] Phone: [phone] Fax: [phone]     Patient notified that their request is being sent to the clinical staff for review and that they should receive a response within 2 business days.   Please advise at Mobile [phone] (mobile)

## 2022-09-21 NOTE — Telephone Encounter (Signed)
Called pharmacy and they had the RX date 09/07/22 and will fill that RX.  Called and advised patient.  Dm/cma

## 2022-10-18 ENCOUNTER — Other Ambulatory Visit: Payer: Self-pay | Admitting: Family Medicine

## 2022-10-18 DIAGNOSIS — F988 Other specified behavioral and emotional disorders with onset usually occurring in childhood and adolescence: Secondary | ICD-10-CM

## 2022-10-18 MED ORDER — AMPHETAMINE-DEXTROAMPHETAMINE 20 MG PO TABS: 20.0000 mg | ORAL_TABLET | ORAL | 0 refills | Status: DC

## 2022-10-18 MED ORDER — AMPHETAMINE-DEXTROAMPHETAMINE 10 MG PO TABS
ORAL_TABLET | ORAL | 0 refills | Status: DC
Start: 2022-10-18 — End: 2022-11-10

## 2022-10-18 NOTE — Telephone Encounter (Signed)
Refill request for  Adderall 20 mg LR 09/07/22, #30  Adderall 10 mg LR  09/15/22, #30 LOV 06/11/22 FOV  none scheduled.   Please review and advise.  Thanks. Dm/cma

## 2022-10-18 NOTE — Telephone Encounter (Signed)
Gary Jennings [phone]  [facility] needs refill for amphetamine-dextroamphetamine (ADDERALL) 20 MG tablet [[id number]]     [facility] #[id number] [place] [address], [place] - [address] AT [facility] [address] & [address] [place] [address] Phone: [phone]  Fax: [phone]

## 2022-10-19 NOTE — Telephone Encounter (Signed)
Patient notified VIA phone. Dm/cma  

## 2022-11-10 ENCOUNTER — Other Ambulatory Visit: Payer: Self-pay | Admitting: Family Medicine

## 2022-11-10 DIAGNOSIS — F988 Other specified behavioral and emotional disorders with onset usually occurring in childhood and adolescence: Secondary | ICD-10-CM

## 2022-11-10 MED ORDER — AMPHETAMINE-DEXTROAMPHETAMINE 10 MG PO TABS
ORAL_TABLET | ORAL | 0 refills | Status: DC
Start: 2022-11-10 — End: 2022-12-08

## 2022-11-10 MED ORDER — AMPHETAMINE-DEXTROAMPHETAMINE 20 MG PO TABS: 20.0000 mg | ORAL_TABLET | ORAL | 0 refills | Status: DC

## 2022-11-10 NOTE — Telephone Encounter (Signed)
Refill request for  Adderall 20 mg LR 10/18/22, #30, 0   Adderall 10 mg  LR 10/18/22, #30, 0 rf LOV 06/11/22 FOV none scheduled.    Please review and advise.  Thanks. Dm/cma

## 2022-11-10 NOTE — Telephone Encounter (Signed)
Prescription Request  11/10/2022  LOV: 06/29/2022  What is the name of the medication or equipment? amphetamine-dextroamphetamine (ADDERALL) 20 MG tablet [[id number]]   Have you contacted your pharmacy to request a refill? No   Which pharmacy would you like this sent to?     [facility] #[id number] [place] [address], [place] - [address] AT [facility] [address] & [address] [place] [address] Phone: [phone] Fax: [phone]   Patient notified that their request is being sent to the clinical staff for review and that they should receive a response within 2 business days.   Please advise at Mobile [phone] (mobile)

## 2022-11-10 NOTE — Telephone Encounter (Signed)
Patient notified VIA phone. Dm/cma  

## 2022-12-08 ENCOUNTER — Other Ambulatory Visit: Payer: Self-pay | Admitting: Family Medicine

## 2022-12-08 DIAGNOSIS — F988 Other specified behavioral and emotional disorders with onset usually occurring in childhood and adolescence: Secondary | ICD-10-CM

## 2022-12-08 MED ORDER — AMPHETAMINE-DEXTROAMPHETAMINE 10 MG PO TABS: ORAL_TABLET | ORAL | 0 refills | Status: DC

## 2022-12-08 MED ORDER — AMPHETAMINE-DEXTROAMPHETAMINE 20 MG PO TABS: 20.0000 mg | ORAL_TABLET | ORAL | 0 refills | Status: DC

## 2022-12-08 NOTE — Telephone Encounter (Signed)
Patient scheduled for f/u appt @10 :20.  Dm/cma

## 2022-12-08 NOTE — Telephone Encounter (Signed)
Refill request for  Adderall 10 mg LR  11/10/22, #30, 0 rf  Adderall 20 mg LR  11/10/22, #30, 0 rf LOV 06/29/22 FOV   none scheduled.   Please review and advise.  Thanks.  Dm/cma

## 2022-12-08 NOTE — Telephone Encounter (Signed)
Refill request   amphetamine-dextroamphetamine (ADDERALL) 10 MG tablet [[id number]]    [facility] #[id number] [place] [address], [place] - [address] AT [facility] [address] & [facility] [address] [place] [address] Phone: [phone]  Fax: [phone]

## 2022-12-09 ENCOUNTER — Encounter: Payer: Self-pay | Admitting: Family Medicine

## 2022-12-09 ENCOUNTER — Ambulatory Visit: Payer: No Typology Code available for payment source | Admitting: Family Medicine

## 2022-12-09 VITALS — BP 118/70 | HR 65 | Temp 98.0°F | Ht 68.0 in | Wt 204.2 lb

## 2022-12-09 DIAGNOSIS — F902 Attention-deficit hyperactivity disorder, combined type: Secondary | ICD-10-CM | POA: Diagnosis not present

## 2022-12-09 DIAGNOSIS — L219 Seborrheic dermatitis, unspecified: Secondary | ICD-10-CM | POA: Insufficient documentation

## 2022-12-09 MED ORDER — TRIAMCINOLONE ACETONIDE 0.1 % EX CREA
1.0000 | TOPICAL_CREAM | Freq: Two times a day (BID) | CUTANEOUS | 0 refills | Status: AC
Start: 2022-12-09 — End: ?

## 2022-12-09 MED ORDER — AMPHETAMINE-DEXTROAMPHETAMINE 20 MG PO TABS
20.0000 mg | ORAL_TABLET | Freq: Two times a day (BID) | ORAL | 0 refills | Status: DC
Start: 2022-12-09 — End: 2023-01-21

## 2022-12-09 MED ORDER — SELENIUM SULFIDE 2.25 % EX SHAM
MEDICATED_SHAMPOO | CUTANEOUS | 11 refills | Status: AC
Start: 2022-12-09 — End: ?

## 2022-12-09 NOTE — Assessment & Plan Note (Signed)
Now that Gary Jennings is off of vilazodone, his ADHD does not seem as well controlled. He notes this more in the afternoon. He prefers to not take extended release formulations, as these do seem to cause some insomnia.  I will try him on  Adderall 20 mg bid (increasing his afternoon dose).

## 2022-12-09 NOTE — Progress Notes (Signed)
[facility] [address] [place] [address] Dept: [phone] Dept Fax: [phone]  Chronic Care Office Visit  Subjective:    Patient ID: Gary Jennings, male    DOB: 21-Apr-1990, 32 y.o..   MRN: [id number]  Chief Complaint  Patient presents with   ADHD    F/u ADHD.     History of Present Illness:  Patient is in today for reassessment of chronic medical issues.  Mr. Somers has a history of ADHD, diagnosed in childhood. He is managed on Adderall 20 mg q am and Adderall 10 mg qpm. Since stopping the Viibryd, he is finding his focus seems to be decreased. He has found work to be more difficult.   Mr. Stafford notes he gets recurrent areas of redness with scaliness on the forehead  and along the sides of the nose, behind the ears, and also notes scaliness of the scalp. He has tried using OTC dandruff shampoos and OTC steroid creams, but note these do not control his symptoms well.  Past Medical History: Patient Active Problem List   Diagnosis Date Noted   Seborrhea 12/09/2022   At risk for sleep apnea 01/28/2022   Class 1 obesity due to excess calories without serious comorbidity with body mass index (BMI) of 32.0 to 32.9 in adult 01/28/2022   Penile lesion 07/16/2021   Tinea capitis 07/16/2021   Patellar tendinitis, left knee 06/23/2020   Loose body in knee, right knee 06/23/2020   Generalized anxiety disorder 09/06/2016   GERD 01/03/2008   Attention deficit disorder 04/17/2007   History reviewed. No pertinent surgical history. Family History  Problem Relation Age of Onset   COPD Father    Stroke Maternal Grandfather    Heart disease Maternal Grandfather    Stroke Paternal Grandfather    Outpatient Medications Prior to Visit  Medication Sig Dispense Refill   albuterol (VENTOLIN HFA) 108 (90 Base) MCG/ACT inhaler Inhale 2 puffs into the lungs 15 min. prior to exercise 8 g 2   amphetamine-dextroamphetamine (ADDERALL)  10 MG tablet Take 1 tablet (10 mg total) by mouth daily at 2 pm. 30 tablet 0   amphetamine-dextroamphetamine (ADDERALL) 20 MG tablet Take 1 tablet (20 mg total) by mouth every morning. 30 tablet 0   Vilazodone HCl 20 MG TABS Take 1.5 tablets (30 mg total) by mouth daily. 45 tablet 11   No facility-administered medications prior to visit.   Allergies  Allergen Reactions   Lexapro [Escitalopram Oxalate]     Suicidal thoughts   Ceftin [Cefuroxime Axetil]     n/v   Wellbutrin [Bupropion]     Anxiety, crying spells   Objective:   Today's Vitals   12/09/22 1040  BP: 118/70  Pulse: 65  Temp: 98 F (36.7 C)  TempSrc: Temporal  SpO2: 97%  Weight: 204 lb 3.2 oz (92.6 kg)  Height: 5\' 8"  (1.727 m)   Body mass index is 31.05 kg/m.   General: Well developed, well nourished. No acute distress. Skin: Warm and dry. No redness of face noted today. He does have some mild dandruff. Neuro: CN II-XII intact. Normal sensation and DTR bilaterally. Psych: Alert and oriented. Normal mood and affect.  Health Maintenance Due  Topic Date Due   Hepatitis C Screening  Never done     Assessment & Plan:   Problem List Items Addressed This Visit       Musculoskeletal and Integument   Seborrhea    Discussed control of dandruff and  facial seborrhea. I will prescribe a selenium sulfide shampoo and TAC cream for management.      Relevant Medications   Selenium Sulfide 2.25 % SHAM   triamcinolone cream (KENALOG) 0.1 %     Other   Attention deficit disorder - Primary    Now that Mr. Sliger is off of vilazodone, his ADHD does not seem as well controlled. He notes this more in the afternoon. He prefers to not take extended release formulations, as these do seem to cause some insomnia.  I will try him on  Adderall 20 mg bid (increasing his afternoon dose).       Relevant Medications   amphetamine-dextroamphetamine (ADDERALL) 20 MG tablet    Return in about 6 weeks (around 01/20/2023) for  Reassessment.   Loyola Mast, MD

## 2022-12-09 NOTE — Assessment & Plan Note (Signed)
Discussed control of dandruff and facial seborrhea. I will prescribe a selenium sulfide shampoo and TAC cream for management.

## 2022-12-21 NOTE — Telephone Encounter (Signed)
Pharmacy change request and refill request  amphetamine-dextroamphetamine (ADDERALL) 20 MG tablet [[id number]]   Pt is having trouble with the [facility] on file. He wants his meds sent to   [facility] on [address] [place]. From now on.

## 2023-01-20 ENCOUNTER — Other Ambulatory Visit: Payer: Self-pay | Admitting: Family Medicine

## 2023-01-20 DIAGNOSIS — F902 Attention-deficit hyperactivity disorder, combined type: Secondary | ICD-10-CM

## 2023-01-20 NOTE — Telephone Encounter (Signed)
Prescription Request  01/20/2023  LOV: 12/09/2022  What is the name of the medication or equipment? amphetamine-dextroamphetamine (ADDERALL) 20 MG tablet [[id number]]   Have you contacted your pharmacy to request a refill? No   Which pharmacy would you like this sent to?   [id number] [place], [place] - [address] AT [facility] [address] & [address] [place] [address] Phone: [phone] Fax: [phone]   Patient notified that their request is being sent to the clinical staff for review and that they should receive a response within 2 business days.   Please advise at Mobile [phone] (mobile)

## 2023-01-21 MED ORDER — AMPHETAMINE-DEXTROAMPHETAMINE 20 MG PO TABS
20.0000 mg | ORAL_TABLET | Freq: Two times a day (BID) | ORAL | 0 refills | Status: DC
Start: 2023-01-21 — End: 2023-02-25

## 2023-01-21 NOTE — Telephone Encounter (Signed)
Refill request for  Adderall 20 mg LR  12/09/22, #60, 0 rf LOV  12/09/22 FOV   none scheduled.    Please review and advise.  Thanks Dm/cma

## 2023-01-25 NOTE — Telephone Encounter (Signed)
Patient notified VIA phone. Dm/cma

## 2023-02-25 ENCOUNTER — Other Ambulatory Visit: Payer: Self-pay | Admitting: Family Medicine

## 2023-02-25 DIAGNOSIS — F902 Attention-deficit hyperactivity disorder, combined type: Secondary | ICD-10-CM

## 2023-02-25 NOTE — Telephone Encounter (Signed)
Copied from CRM [id number]. Topic: Clinical - Medication Refill >> Feb 25, 2023  3:14 PM Isabell A wrote: Most Recent Primary Care Visit:  Provider: Loyola Mast  Department: [facility]  Visit Type: OFFICE VISIT  Date: 12/09/2022  Medication: ***  Has the patient contacted their pharmacy?  (Agent: If no, request that the patient contact the pharmacy for the refill. If patient does not wish to contact the pharmacy document the reason why and proceed with request.) (Agent: If yes, when and what did the pharmacy advise?)  Is this the correct pharmacy for this prescription?  If no, delete pharmacy and type the correct one.  This is the patient's preferred pharmacy:  [facility][id number] [place], [place] - [address] AT [facility] [address] & [address] [place]  [address] Phone: [phone] Fax: [phone]  [id number] [place] [facility] - [address] [place] [address] Phone: [phone] Fax: [phone]   Has the prescription been filled recently?   Is the patient out of the medication?   Has the patient been seen for an appointment in the last year OR does the patient have an upcoming appointment?   Can we respond through MyChart?   Agent: Please be advised that Rx refills may take up to 3 business days. We ask that you follow-up with your pharmacy.

## 2023-02-25 NOTE — Telephone Encounter (Signed)
LR  01/21/23, #60, 0 rf LOV  1010/24 FOV   none scheduled.  Please review and advise.  Thanks. Dm/cma

## 2023-02-26 MED ORDER — AMPHETAMINE-DEXTROAMPHETAMINE 20 MG PO TABS
20.0000 mg | ORAL_TABLET | Freq: Two times a day (BID) | ORAL | 0 refills | Status: DC
Start: 2023-02-26 — End: 2023-03-29

## 2023-03-29 ENCOUNTER — Other Ambulatory Visit: Payer: Self-pay | Admitting: Family Medicine

## 2023-03-29 DIAGNOSIS — F902 Attention-deficit hyperactivity disorder, combined type: Secondary | ICD-10-CM

## 2023-03-29 MED ORDER — AMPHETAMINE-DEXTROAMPHETAMINE 20 MG PO TABS
20.0000 mg | ORAL_TABLET | Freq: Two times a day (BID) | ORAL | 0 refills | Status: DC
Start: 2023-03-29 — End: 2023-05-02

## 2023-03-29 NOTE — Telephone Encounter (Signed)
Refill request for  Adderall 20 mg LR 02/26/23, #60, 0 rf LOV  12/09/22 FOV  none scheduled.   Please review and advise.  Thanks  Dm/cma

## 2023-03-29 NOTE — Telephone Encounter (Signed)
Copied from CRM [id number]. Topic: Clinical - Medication Refill >> Mar 29, 2023  2:13 PM Fonda Kinder J wrote: Most Recent Primary Care Visit:  Provider: Loyola Mast  Department: [facility]  Visit Type: OFFICE VISIT  Date: 12/09/2022  Medication: amphetamine-dextroamphetamine (ADDERALL) 20 MG  Has the patient contacted their pharmacy? Yes (Agent: If no, request that the patient contact the pharmacy for the refill. If patient does not wish to contact the pharmacy document the reason why and proceed with request.) (Agent: If yes, when and what did the pharmacy advise?) Contact PCP  Is this the correct pharmacy for this prescription? Yes If no, delete pharmacy and type the correct one.  This is the patient's preferred pharmacy:  [id number] [place], [place] - [address] AT [facility] [address] & [address] [place] [address] Phone: [phone] Fax: [phone]     Has the prescription been filled recently? No  Is the patient out of the medication? Yes  Has the patient been seen for an appointment in the last year OR does the patient have an upcoming appointment? Yes  Can we respond through MyChart? No  Agent: Please be advised that Rx refills may take up to 3 business days. We ask that you follow-up with your pharmacy.

## 2023-03-30 NOTE — Telephone Encounter (Signed)
Lft VM  that RX was sent to the pharmacy. Dm/cma

## 2023-03-31 NOTE — Telephone Encounter (Signed)
Please advise   Copied from CRM [id number]. Topic: Clinical - Medication Question >> Mar 31, 2023  9:50 AM Lennart Pall wrote: Reason for CRM: Patient received a call from the pharmacy that his medication of adderall is on a major backorder for the 20MG , they are asking for a new prescription to be sent at a lower dosage so he can get it filled

## 2023-05-02 ENCOUNTER — Other Ambulatory Visit: Payer: Self-pay | Admitting: Family Medicine

## 2023-05-02 DIAGNOSIS — F902 Attention-deficit hyperactivity disorder, combined type: Secondary | ICD-10-CM

## 2023-05-02 MED ORDER — AMPHETAMINE-DEXTROAMPHETAMINE 20 MG PO TABS
20.0000 mg | ORAL_TABLET | Freq: Two times a day (BID) | ORAL | 0 refills | Status: DC
Start: 2023-05-02 — End: 2023-05-30

## 2023-05-02 NOTE — Telephone Encounter (Signed)
 Copied from CRM [id number]. Topic: Clinical - Medication Refill >> May 02, 2023 11:48 AM Fredrich Romans wrote: Most Recent Primary Care Visit:  Provider: Loyola Mast  Department: [facility]  Visit Type: OFFICE VISIT  Date: 12/09/2022  Medication: amphetamine-dextroamphetamine (ADDERALL) 20 MG tablet  Has the patient contacted their pharmacy? No (Agent: If no, request that the patient contact the pharmacy for the refill. If patient does not wish to contact the pharmacy document the reason why and proceed with request.) (Agent: If yes, when and what did the pharmacy advise?)  Is this the correct pharmacy for this prescription? Yes If no, delete pharmacy and type the correct one.  This is the patient's preferred pharmacy:  [id number] [place], [place] - [address] AT [facility] [address] [place] [address] Phone: [phone] Fax: [phone]    Has the prescription been filled recently? No  Is the patient out of the medication? Yes  Has the patient been seen for an appointment in the last year OR does the patient have an upcoming appointment? Yes  Can we respond through MyChart? Yes  Agent: Please be advised that Rx refills may take up to 3 business days. We ask that you follow-up with your pharmacy.

## 2023-05-02 NOTE — Telephone Encounter (Signed)
 Refill request for  Adderall 20 mg LR  03/29/23 LOV  12/09/22 FOV.  None scheduled.   Please review and advise.  Thanks. Dm/cma

## 2023-05-26 ENCOUNTER — Ambulatory Visit: Payer: Self-pay

## 2023-05-26 NOTE — Telephone Encounter (Signed)
 Chief Complaint: possible UTI Symptoms: "tingling" sensation before and during urination, frequency, cloudy/dark urine Frequency: 1 wk Pertinent Negatives: Patient denies fever, chills, flank pain, back pain, abdominal pain, hematuria, N/V/D Disposition: [] ED /[] Urgent Care (no appt availability in office) / [x] Appointment(In office/virtual)/ []  [place] [facility]/ [] Home Care/ [] Refused Recommended Disposition /[] [facility] [place] [facility]/ []  Follow-up with PCP Additional Notes: Pt reports possible UTI with symptoms for 1 wk. Pt endorses frequent UTIs when he was young but has not had one since he was 16. Pt endorses an "annoying tingling sensation" before and during urinating. Pt rates that pain a 3/10. Pt endorses frequency and states his urine is more cloudy/dark than is typical for him. Pt denies flank pain, abdominal pain, hematuria, fever, chills, and N/V/D. RN advised pt he should be seen within 24 hours. No availability at his PCP office during that time frame. Pt willing to be seen at another office. RN scheduled pt for 1100 tomorrow 3/28 at [facility] since it is close to his home address. Pt agreeable to that plan. RN advised pt if he worsens before then he needs to call us back. Pt verbalized understanding.        Copied From CRM [id number]. Reason for Triage: Patient experiencing UTI symptoms. Stated he has a "tingling" sensation when trying to urinate and is having to go more frequently. Patient denied having any pain. Symptoms have been going on for the past week   Reason for Disposition  Urinating more frequently than usual (i.e., frequency)  Answer Assessment - Initial Assessment Questions 1. SYMPTOM: "What's the main symptom you're concerned about?" (e.g., frequency, incontinence)     "Tingly, annoying sensation" before and with urinating (3/10 pain), endorses frequency  2. ONSET: "When did the symptoms start?"     Since last Thursday 3. PAIN: "Is there any pain?" If Yes, ask:  "How bad is it?" (Scale: 1-10; mild, moderate, severe)     3/10 4. CAUSE: "What do you think is causing the symptoms?"     Poss UTI 5. OTHER SYMPTOMS: "Do you have any other symptoms?" (e.g., blood in urine, fever, flank pain, pain with urination)     Denies flank pain, denies back pain, denies abd pain, denies fever, denies hematuria, endorses that his urine appears a little cloudy/dark, denies N/V/D. Pt states he got UTIs frequently as a kid  Protocols used: Urinary Symptoms-A-AH

## 2023-05-26 NOTE — Telephone Encounter (Signed)
 Noted. Dm/cma

## 2023-05-27 ENCOUNTER — Encounter: Payer: Self-pay | Admitting: Family Medicine

## 2023-05-27 ENCOUNTER — Ambulatory Visit: Admitting: Family Medicine

## 2023-05-27 ENCOUNTER — Other Ambulatory Visit (HOSPITAL_COMMUNITY)
Admission: RE | Admit: 2023-05-27 | Discharge: 2023-05-27 | Disposition: A | Discharge status: Home / Self Care | Source: Ambulatory Visit | Attending: Family Medicine | Admitting: Family Medicine

## 2023-05-27 VITALS — BP 122/75 | HR 58 | Temp 97.5°F | Ht 68.0 in | Wt 200.0 lb

## 2023-05-27 DIAGNOSIS — R300 Dysuria: Secondary | ICD-10-CM | POA: Diagnosis present

## 2023-05-27 LAB — POC URINALSYSI DIPSTICK (AUTOMATED)
Bilirubin, UA: NEGATIVE
Blood, UA: NEGATIVE
Glucose, UA: NEGATIVE
Ketones, UA: NEGATIVE
Leukocytes, UA: NEGATIVE
Nitrite, UA: NEGATIVE
Protein, UA: NEGATIVE
Spec Grav, UA: 1.02 (ref 1.010–1.025)
Urobilinogen, UA: 0.2 U/dL
pH, UA: 5 (ref 5.0–8.0)

## 2023-05-27 NOTE — Progress Notes (Signed)
 Acute Office Visit  Subjective:     Patient ID: Gary Jennings, male    DOB: Jan 17, 1991, 33 y.o.   MRN: [id number]  Chief Complaint  Patient presents with   Dysuria     Patient is in today for dysuria.   Discussed the use of AI scribe software for clinical note transcription with the patient, who gave verbal consent to proceed.  History of Present Illness Gary Jennings is a 33 year old male who presents with urinary discomfort and irritation before urination.  He has been experiencing irritation and discomfort before urination for approximately one week. The sensation occurs when his bladder feels full and is described as a 'little annoyance' located in the penis. It does not persist throughout the day but occurs randomly, particularly before urination. No burning sensation is reported.  His urine has appeared darker than usual despite drinking six or seven six-ounce bottles of water daily. No fever, chills, blood in the urine, or changes in bowel habits. No nausea, vomiting, back pain, or changes in the genital region such as rashes or lesions.  He has a history of urinary tract infections during his teenage years, which he associated with high soda consumption. Recently, he consumed more soda and energy drinks than usual due to a busy work week, which he suspects may have contributed to his current symptoms.  He denies concerns about sexually transmitted diseases.       All review of systems negative except what is listed in the HPI      Objective:    BP 122/75   Pulse (!) 58   Temp (!) 97.5 F (36.4 C) (Oral)   Ht 5\' 8"  (1.727 m)   Wt 200 lb (90.7 kg)   SpO2 98%   BMI 30.41 kg/m    Physical Exam Vitals reviewed.  Constitutional:      Appearance: Normal appearance.  Cardiovascular:     Rate and Rhythm: Normal rate and regular rhythm.  Pulmonary:     Effort: Pulmonary effort is normal.     Breath sounds: Normal breath sounds.  Abdominal:     General:  There is no distension.     Tenderness: There is no abdominal tenderness. There is no right CVA tenderness, left CVA tenderness or guarding.  Genitourinary:    Comments: Deferred  Skin:    General: Skin is warm and dry.  Neurological:     Mental Status: He is alert and oriented to person, place, and time.  Psychiatric:        Mood and Affect: Mood normal.        Behavior: Behavior normal.        Thought Content: Thought content normal.        Judgment: Judgment normal.         Results for orders placed or performed in visit on 05/27/23  POCT Urinalysis Dipstick (Automated)  Result Value Ref Range   Color, UA yellow    Clarity, UA clear    Glucose, UA Negative Negative   Bilirubin, UA negative    Ketones, UA negative    Spec Grav, UA 1.020 1.010 - 1.025   Blood, UA negative    pH, UA 5.0 5.0 - 8.0   Protein, UA Negative Negative   Urobilinogen, UA 0.2 0.2 or 1.0 E.U./dL   Nitrite, UA negative    Leukocytes, UA Negative Negative        Assessment & Plan:   Problem List Items Addressed This  Visit   None Visit Diagnoses       Dysuria    -  Primary   Relevant Orders   POCT Urinalysis Dipstick (Automated) (Completed)   Urine Culture   Urine cytology ancillary only     UA unremarkable. Will send for culture Agreeable to add STD screening to urine to be safe. Declined any additional STD testing today. Encouraged increasing hydration and minimizing sugar/caffeine intake. Patient aware of signs/symptoms requiring further/urgent evaluation.   No orders of the defined types were placed in this encounter.   Return if symptoms worsen or fail to improve.  Clayborne Dana, NP

## 2023-05-28 LAB — URINE CULTURE
MICRO NUMBER:: 16261094
Result:: NO GROWTH
SPECIMEN QUALITY:: ADEQUATE

## 2023-05-30 ENCOUNTER — Other Ambulatory Visit: Payer: Self-pay | Admitting: Family Medicine

## 2023-05-30 ENCOUNTER — Encounter: Payer: Self-pay | Admitting: Family Medicine

## 2023-05-30 DIAGNOSIS — F902 Attention-deficit hyperactivity disorder, combined type: Secondary | ICD-10-CM

## 2023-05-30 LAB — URINE CYTOLOGY ANCILLARY ONLY
Chlamydia: NEGATIVE
Comment: NEGATIVE
Comment: NEGATIVE
Comment: NORMAL
Neisseria Gonorrhea: NEGATIVE
Trichomonas: NEGATIVE

## 2023-05-30 MED ORDER — AMPHETAMINE-DEXTROAMPHETAMINE 20 MG PO TABS
20.0000 mg | ORAL_TABLET | Freq: Two times a day (BID) | ORAL | 0 refills | Status: DC
Start: 2023-05-30 — End: 2023-06-22

## 2023-05-30 NOTE — Telephone Encounter (Signed)
 Refill request for Adderall 20 mg LR  05/02/23 LOV  12/09/22 FOV   none scheduled.   Please review and advise.  Thanks. Dm/cma

## 2023-06-01 ENCOUNTER — Telehealth: Payer: Self-pay

## 2023-06-01 ENCOUNTER — Other Ambulatory Visit (HOSPITAL_COMMUNITY): Payer: Self-pay

## 2023-06-01 NOTE — Telephone Encounter (Signed)
 Pharmacy Patient Advocate Encounter   Received notification from Pt Calls Messages that prior authorization for Amphetamine-Dextroamphetamine 20MG  tablets is required/requested.   Insurance verification completed.   The patient is insured through  [employer]  .    Placed a call to the insurance at [phone], per the representative, will fax over the PA forms to be filled out.   Phone# [phone] Fax# [phone]

## 2023-06-01 NOTE — Telephone Encounter (Signed)
 Can you please and thank you start a PA for the Adderall 20 mg  for patient.   Thanks. Dm/cma

## 2023-06-02 NOTE — Telephone Encounter (Signed)
 Patient schedule 06/08/23 @ 11:00.  Dm/cma

## 2023-06-03 ENCOUNTER — Other Ambulatory Visit (HOSPITAL_COMMUNITY): Payer: Self-pay

## 2023-06-03 NOTE — Telephone Encounter (Signed)
 Completed fax form and faxed back to insurance.   Phone# [phone] Fax# [phone]

## 2023-06-08 ENCOUNTER — Other Ambulatory Visit (HOSPITAL_COMMUNITY): Payer: Self-pay

## 2023-06-08 ENCOUNTER — Encounter: Payer: Self-pay | Admitting: Family Medicine

## 2023-06-08 ENCOUNTER — Ambulatory Visit: Admitting: Family Medicine

## 2023-06-08 VITALS — BP 112/76 | HR 58 | Temp 97.7°F | Ht 68.0 in | Wt 200.7 lb

## 2023-06-08 DIAGNOSIS — F902 Attention-deficit hyperactivity disorder, combined type: Secondary | ICD-10-CM

## 2023-06-08 DIAGNOSIS — F411 Generalized anxiety disorder: Secondary | ICD-10-CM | POA: Diagnosis not present

## 2023-06-08 DIAGNOSIS — M545 Low back pain, unspecified: Secondary | ICD-10-CM | POA: Diagnosis not present

## 2023-06-08 NOTE — Progress Notes (Signed)
 [facility]ARE [facility] [address] [place] [address] Dept: [phone] Dept Fax: [phone]  Chronic Care Office Visit  Subjective:    Patient ID: Gary Jennings, male    DOB: November 03, 1990, 33 y.o..   MRN: [id number]  Chief Complaint  Patient presents with   ADHD    F/u meds.  C/o having pain in the lower back pain.    History of Present Illness:  Patient is in today for reassessment of chronic medical issues.  Gary Jennings has a history of ADHD, diagnosed in childhood. He is currently managed on Adderall 20 mg bid. He feels his focus is good and wants to continue this current dose.  Gary Jennings has a history of anxiety. He had previously been on vilazodone. He had decided to stop the medication, noting he does not like being "dependant"  on medications for managing this. He notes that most of the time he feels he is doing well. However, he notes that for 2-3 days, early in the calendar month, he can exhibit increased anxiety and obsessive thoughts. He is not sure if he wants to resume a medication for this, though he admits it did help in the past. He is also unsure about seeking therapy, though he also found this helpful previously.  Gary Jennings notes that he had some acute lowe back pain and stiffness on Sunday. He denies any numbness, tingling, or pain in the lower extremities. He denies any injury to the back. He does recall that he slept on the couch on Sat. night.  Past Medical History: Patient Active Problem List   Diagnosis Date Noted   Seborrhea 12/09/2022   At risk for sleep apnea 01/28/2022   Class 1 obesity due to excess calories without serious comorbidity with body mass index (BMI) of 32.0 to 32.9 in adult 01/28/2022   Penile lesion 07/16/2021   Tinea capitis 07/16/2021   Patellar tendinitis, left knee 06/23/2020   Loose body in knee, right knee 06/23/2020   Generalized anxiety disorder 09/06/2016   GERD 01/03/2008    Attention deficit disorder 04/17/2007   History reviewed. No pertinent surgical history. Family History  Problem Relation Age of Onset   COPD Father    Stroke Maternal Grandfather    Heart disease Maternal Grandfather    Stroke Paternal Grandfather    Outpatient Medications Prior to Visit  Medication Sig Dispense Refill   albuterol (VENTOLIN HFA) 108 (90 Base) MCG/ACT inhaler Inhale 2 puffs into the lungs 15 min. prior to exercise 8 g 2   amphetamine-dextroamphetamine (ADDERALL) 20 MG tablet Take 1 tablet (20 mg total) by mouth 2 (two) times daily. 30 tablet 0   Selenium Sulfide 2.25 % SHAM Use as directed on scalp 2-3 times a week 180 mL 11   triamcinolone cream (KENALOG) 0.1 % Apply 1 Application topically 2 (two) times daily. 30 g 0   No facility-administered medications prior to visit.   Allergies  Allergen Reactions   Lexapro [Escitalopram Oxalate]     Suicidal thoughts   Ceftin [Cefuroxime Axetil]     n/v   Wellbutrin [Bupropion]     Anxiety, crying spells   Objective:   Today's Vitals   06/08/23 1057  BP: 112/76  Pulse: (!) 58  Temp: 97.7 F (36.5 C)  TempSrc: Temporal  SpO2: 99%  Weight: 200 lb 11.2 oz (91 kg)  Height: 5\' 8"  (1.727 m)   Body mass index is 30.52 kg/m.   General: Well developed,  well nourished. No acute distress. Psych: Alert and oriented. Normal mood and affect.  Health Maintenance Due  Topic Date Due   Hepatitis C Screening  Never done     Assessment & Plan:   Problem List Items Addressed This Visit       Other   Attention deficit disorder - Primary   Stable. Continue  Adderall 20 mg bid.       Generalized anxiety disorder   Gary Jennings is having some increased anxiety since stopping vilazodone. He is unsure about whether he would want to take medication or seek counseling for this. We discussed the potential benefits of this. For now, I will leave it to him to decide if he wants to engage in either.      Other Visit Diagnoses        Acute midline low back pain without sciatica       Discussed use of heat, stretches, or NSAIDs until this resolves. Recommend not sleeping on unsupported surfaces, such as a couch.       Return in about 6 months (around 12/08/2023) for Reassessment.   Loyola Mast, MD

## 2023-06-08 NOTE — Assessment & Plan Note (Signed)
 Stable. Continue  Adderall 20 mg bid.

## 2023-06-08 NOTE — Assessment & Plan Note (Signed)
 Mr. Gary Jennings is having some increased anxiety since stopping vilazodone. He is unsure about whether he would want to take medication or seek counseling for this. We discussed the potential benefits of this. For now, I will leave it to him to decide if he wants to engage in either.

## 2023-06-08 NOTE — Patient Instructions (Signed)
 Acute Back Pain, Adult Acute back pain is sudden and usually short-lived. It is often caused by an injury to the muscles and tissues in the back. The injury may result from: A muscle, tendon, or ligament getting overstretched or torn. Ligaments are tissues that connect bones to each other. Lifting something improperly can cause a back strain. Wear and tear (degeneration) of the spinal disks. Spinal disks are circular tissue that provide cushioning between the bones of the spine (vertebrae). Twisting motions, such as while playing sports or doing yard work. A hit to the back. Arthritis. You may have a physical exam, lab tests, and imaging tests to find the cause of your pain. Acute back pain usually goes away with rest and home care. Follow these instructions at home: Managing pain, stiffness, and swelling Take over-the-counter and prescription medicines only as told by your health care provider. Treatment may include medicines for pain and inflammation that are taken by mouth or applied to the skin, or muscle relaxants. Your health care provider may recommend applying ice during the first 24-48 hours after your pain starts. To do this: Put ice in a plastic bag. Place a towel between your skin and the bag. Leave the ice on for 20 minutes, 2-3 times a day. Remove the ice if your skin turns bright red. This is very important. If you cannot feel pain, heat, or cold, you have a greater risk of damage to the area. If directed, apply heat to the affected area as often as told by your health care provider. Use the heat source that your health care provider recommends, such as a moist heat pack or a heating pad. Place a towel between your skin and the heat source. Leave the heat on for 20-30 minutes. Remove the heat if your skin turns bright red. This is especially important if you are unable to feel pain, heat, or cold. You have a greater risk of getting burned. Activity  Do not stay in bed. Staying in  bed for more than 1-2 days can delay your recovery. Sit up and stand up straight. Avoid leaning forward when you sit or hunching over when you stand. If you work at a desk, sit close to it so you do not need to lean over. Keep your chin tucked in. Keep your neck drawn back, and keep your elbows bent at a 90-degree angle (right angle). Sit high and close to the steering wheel when you drive. Add lower back (lumbar) support to your car seat, if needed. Take short walks on even surfaces as soon as you are able. Try to increase the length of time you walk each day. Do not sit, drive, or stand in one place for more than 30 minutes at a time. Sitting or standing for long periods of time can put stress on your back. Do not drive or use heavy machinery while taking prescription pain medicine. Use proper lifting techniques. When you bend and lift, use positions that put less stress on your back: [place] your knees. Keep the load close to your body. Avoid twisting. Exercise regularly as told by your health care provider. Exercising helps your back heal faster and helps prevent back injuries by keeping muscles strong and flexible. Work with a physical therapist to make a safe exercise program, as recommended by your health care provider. Do any exercises as told by your physical therapist. Lifestyle Maintain a healthy weight. Extra weight puts stress on your back and makes it difficult to have good  posture. Avoid activities or situations that make you feel anxious or stressed. Stress and anxiety increase muscle tension and can make back pain worse. Learn ways to manage anxiety and stress, such as through exercise. General instructions Sleep on a firm mattress in a comfortable position. Try lying on your side with your knees slightly bent. If you lie on your back, put a pillow under your knees. Keep your head and neck in a straight line with your spine (neutral position) when using electronic equipment like  smartphones or pads. To do this: Raise your smartphone or pad to look at it instead of bending your head or neck to look down. Put the smartphone or pad at the level of your face while looking at the screen. Follow your treatment plan as told by your health care provider. This may include: Cognitive or behavioral therapy. Acupuncture or massage therapy. Meditation or yoga. Contact a health care provider if: You have pain that is not relieved with rest or medicine. You have increasing pain going down into your legs or buttocks. Your pain does not improve after 2 weeks. You have pain at night. You lose weight without trying. You have a fever or chills. You develop nausea or vomiting. You develop abdominal pain. Get help right away if: You develop new bowel or bladder control problems. You have unusual weakness or numbness in your arms or legs. You feel faint. These symptoms may represent a serious problem that is an emergency. Do not wait to see if the symptoms will go away. Get medical help right away. Call your local emergency services (911 in the [place] Do not drive yourself to the hospital. Summary Acute back pain is sudden and usually short-lived. Use proper lifting techniques. When you bend and lift, use positions that put less stress on your back. Take over-the-counter and prescription medicines only as told by your health care provider, and apply heat or ice as told. This information is not intended to replace advice given to you by your health care provider. Make sure you discuss any questions you have with your health care provider. Document Revised: 05/09/2020 Document Reviewed: 05/09/2020 Elsevier Patient Education  2024 [employer].

## 2023-06-09 ENCOUNTER — Other Ambulatory Visit (HOSPITAL_COMMUNITY): Payer: Self-pay

## 2023-06-09 ENCOUNTER — Telehealth: Payer: Self-pay

## 2023-06-09 DIAGNOSIS — F411 Generalized anxiety disorder: Secondary | ICD-10-CM

## 2023-06-09 MED ORDER — VILAZODONE HCL 20 MG PO TABS
20.0000 mg | ORAL_TABLET | Freq: Every day | ORAL | 3 refills | Status: DC
Start: 2023-06-09 — End: 2023-11-08

## 2023-06-09 NOTE — Telephone Encounter (Signed)
 Copied from CRM [id number]. Topic: Referral - Request for Referral >> Jun 09, 2023  9:48 AM Sonny Dandy B wrote: Did the patient discuss referral with their provider in the last year? Yes (If No - schedule appointment) (If Yes - send message)  Appointment offered? No  Type of order/referral and detailed reason for visit: behavioral health therapist   Preference of office, provider, location: [place]  If referral order, have you been seen by this specialty before? No (If Yes, this issue or another issue? When? Where?  Can we respond through MyChart? No

## 2023-06-09 NOTE — Addendum Note (Signed)
 Addended by: Loyola Mast on: 06/09/2023 12:20 PM   Modules accepted: Orders

## 2023-06-09 NOTE — Telephone Encounter (Signed)
 Patient is interested in trying some Vybrid and getting the referral for behavioral council.  Please review and advise.  Thanks. Dm/cma

## 2023-06-09 NOTE — Telephone Encounter (Signed)
 Pharmacy Patient Advocate Encounter  Received notification from  [facility]  that Prior Authorization for Ampheteamine-dextroamphetamine 20mg  tabs has been APPROVED from 06/05/23 to 06/04/24   PA #/Case ID/Reference #: [id number]  Placed a call to [facility] to notify of the approval and the pharmacist stated the order has expired.

## 2023-06-22 ENCOUNTER — Other Ambulatory Visit: Payer: Self-pay | Admitting: Family Medicine

## 2023-06-22 DIAGNOSIS — F902 Attention-deficit hyperactivity disorder, combined type: Secondary | ICD-10-CM

## 2023-06-22 MED ORDER — AMPHETAMINE-DEXTROAMPHETAMINE 20 MG PO TABS: 20.0000 mg | ORAL_TABLET | Freq: Two times a day (BID) | ORAL | 0 refills | Status: DC

## 2023-06-22 NOTE — Telephone Encounter (Signed)
 Copied from CRM [id number]. Topic: Clinical - Medication Refill >> Jun 22, 2023 12:06 PM Jenice Mitts wrote: Most Recent Primary Care Visit:  Provider: Graig Lawyer  Department: [facility]  Visit Type: OFFICE VISIT  Date: 06/08/2023  Medication: amphetamine -dextroamphetamine  (ADDERALL) 20 MG tablet  Has the patient contacted their pharmacy? Yes (Agent: If no, request that the patient contact the pharmacy for the refill. If patient does not wish to contact the pharmacy document the reason why and proceed with request.) (Agent: If yes, when and what did the pharmacy advise?)  Is this the correct pharmacy for this prescription? Yes If no, delete pharmacy and type the correct one.  This is the patient's preferred pharmacy:    [id number] [place], [place] - [address] [place] [address] Phone: [phone] Fax: [phone]     Has the prescription been filled recently? Yes  Is the patient out of the medication? No  Has the patient been seen for an appointment in the last year OR does the patient have an upcoming appointment? Yes  Can we respond through MyChart? Yes  Agent: Please be advised that Rx refills may take up to 3 business days. We ask that you follow-up with your pharmacy.

## 2023-06-22 NOTE — Telephone Encounter (Signed)
 LOV 06/08/2023  LFR 05/30/2023  RF#0  FOV none scheduled.

## 2023-06-29 ENCOUNTER — Telehealth: Payer: Self-pay

## 2023-06-29 NOTE — Telephone Encounter (Signed)
 Returned call and informed patient of date   Copied from CRM [id number]. Topic: Clinical - Medication Question >> Jun 29, 2023 10:37 AM Alyse July wrote: Reason for CRM: Patient would like a call back to confirm when he started taking Vilazodone  HCl 20 MG TABS medication.  [phone]

## 2023-07-01 ENCOUNTER — Other Ambulatory Visit: Payer: Self-pay | Admitting: Family Medicine

## 2023-07-01 ENCOUNTER — Ambulatory Visit (INDEPENDENT_AMBULATORY_CARE_PROVIDER_SITE_OTHER): Admitting: Psychology

## 2023-07-01 DIAGNOSIS — F411 Generalized anxiety disorder: Secondary | ICD-10-CM

## 2023-07-01 DIAGNOSIS — F902 Attention-deficit hyperactivity disorder, combined type: Secondary | ICD-10-CM

## 2023-07-01 NOTE — Telephone Encounter (Signed)
 Requesting: amphetamine -dextroamphetamine  (ADDERALL) 20 MG tablet  Last Visit: 06/08/2023 Next Visit: 07/27/2023 Last Refill: 06/22/23  Please Advise

## 2023-07-01 NOTE — Progress Notes (Signed)
 [facility] Counselor Initial Adult Exam  Name: Gary Jennings Date: 07/01/2023 MRN: [id number] DOB: 03-19-1990 PCP: Graig Lawyer, MD  Time spent: 3:00pm-3:55pm   55 minutes  Guardian/Payee:  Jennifer Moellers requested: No   Reason for Visit /Presenting Problem: Pt present for face-to-face initial assessment via video.  Pt consents to telehealth video session and is aware of limitations and benefits of virtual sessions.   Location of pt: home Location of therapist: home office.  Pt states he is having trouble with anxiety and intrusive thoughts and girfriend issues.   Pt was on Vibryd for 4 years and it helped a lot with his anxiety and intrusive thoughts but he stopped taking the medication a year ago.  Now pt's symptoms are back with a vengeance.   Pt has ocd of thoughts.  Pt feels he needs to go back on Vibryd but he has been obsessing about things he is reading on the internet and worried the medication Gary affect his fertility.   Encouraged pt to talk to his doctor about this.    Pt has been with his girlfriend and lived with her for 4 years.   Pt wants to get married and have kids but is worried about how Vibryd affects fertility.  However since off the Vibryd pt's intrusive thoughts are negatively affecting his relationship with his girlfriend. Worked with pt on strategies for managing intrusive thoughts and encouraged pt to talk with his PCP about restarting the Vibryd.   Mental Status Exam: Appearance:   Casual     Behavior:  Appropriate  Motor:  Normal  Speech/Language:   Normal Rate  Affect:  Appropriate  Mood:  normal  Thought process:  normal  Thought content:    WNL  Sensory/Perceptual disturbances:    WNL  Orientation:  oriented to person, place, time/date, and situation  Attention:  Good  Concentration:  Good  Memory:  WNL  Fund of knowledge:   Good  Insight:    Good  Judgment:   Good  Impulse Control:  Good    Reported Symptoms:  anxiety,  intrusive thoughts  Risk Assessment: Danger to Self:  No Self-injurious Behavior: No Danger to Others: No Duty to Warn:no Physical Aggression / Violence:No  Access to Firearms a concern: No  Gang Involvement:No  Patient / guardian was educated about steps to take if suicide or homicide risk level increases between visits: n/a While future psychiatric events cannot be accurately predicted, the patient does not currently require acute inpatient psychiatric care and does not currently meet [facility]  involuntary commitment criteria.  Substance Abuse History: Current substance abuse: No     Past Psychiatric History:   Previous psychological history is significant for anxiety Outpatient Providers:pt has been in therapy in the past History of Psych Hospitalization: No  Psychological Testing:  n/a    Abuse History:  Victim of: No.,  n/a    Report needed: No. Victim of Neglect:No. Perpetrator of  n/a   Witness / Exposure to Domestic Violence: No   Protective Services Involvement: No  Witness to [employer] Violence:  No   Family History:  Family History  Problem Relation Age of Onset   COPD Father    Stroke Maternal Grandfather    Heart disease Maternal Grandfather    Stroke Paternal Grandfather    Pt grew up with his mother and father until he was 89 yrs old when parents got divorced.  He then lived with mother until pt  was 14 and then pt lived with father.   Pt states he moved out from his mother's house bc "she is crazy."   Mother has a prescription pill problem.  Mother was never really there.  Pt does not have any biological siblings.   He has step brother and step sister from mother's 2nd marriage.  He has half sister and half brother from father's second marriage.    Pt states he gets along with all of the siblings.    Pt has good relationship with stepfather.  Pt has a great relationship with his stepmother.  Pt refers to her as his mom.  Pt and father are close.  Mother has  history of depression, anxiety. Father has anxiety.   Mother abuses prescription drugs.  Father had an alcohol problem but went to AA meetings over a year ago and has been sober since.   No history of physical , sexual , or emotional abuse.     Living situation: the patient lives with his girfriend for past 4 years.   Sexual Orientation: Straight  Relationship Status: single but in relationship with girfriend for 4 years.  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   He works from home.   Military Service: No   Educational History: Education: some college  Religion/Sprituality/World View: Protestant  Any cultural differences that may affect / interfere with treatment:  not applicable   Recreation/Hobbies: boating, golfing, going to [employer], playing with his dog.    Stressors: Other: relationship with girlfriend, intrusive thoughts.     Strengths: Supportive Relationships, Friends, Spirituality, Hopefulness, Self Advocate, and Able to Communicate Effectively  Barriers:  none   Legal History: Pending legal issue / charges: The patient has no significant history of legal issues. History of legal issue / charges:  n/a  Medical History/Surgical History: reviewed Past Medical History:  Diagnosis Date   Acne    ADD (attention deficit disorder)     No past surgical history on file.  Medications: Current Outpatient Medications  Medication Sig Dispense Refill   albuterol  (VENTOLIN  HFA) 108 (90 Base) MCG/ACT inhaler Inhale 2 puffs into the lungs 15 min. prior to exercise 8 g 2   amphetamine -dextroamphetamine  (ADDERALL) 20 MG tablet Take 1 tablet (20 mg total) by mouth 2 (two) times daily. 30 tablet 0   Selenium  Sulfide 2.25 % SHAM Use as directed on scalp 2-3 times a week 180 mL 11    triamcinolone  cream (KENALOG ) 0.1 % Apply 1 Application topically 2 (two) times daily. 30 g 0   Vilazodone  HCl 20 MG TABS Take 1 tablet (20 mg total) by mouth daily at 12 noon. 30 tablet 3   No current facility-administered medications for this visit.    Allergies  Allergen Reactions   Lexapro  [Escitalopram  Oxalate]     Suicidal thoughts   Ceftin  [Cefuroxime  Axetil]     n/v   Wellbutrin  [Bupropion ]     Anxiety, crying spells    Diagnoses:  F41.1  Plan of Care: Recommend ongoing therapy.   Pt participated in setting treatment goals.  Pt wants to improve coping skills and reduce anxiety and intrusive thoughts.  Plan to meet every two weeks.   Pt agrees with treatment plan. Treatment Plan Client Abilities/Strengths  Pt is bright, engaging and motivated for therapy.  Client Treatment Preferences  Individual therapy.  Client Statement of Needs  Improve coping skills.  Symptoms  Autonomic hyperactivity (e.g., palpitations, shortness of breath, dry mouth, trouble swallowing, nausea, diarrhea). Excessive and/or unrealistic worry that is difficult to control occurring more days than not for at least 6 months about a number of events or activities. Hypervigilance (e.g., feeling constantly on edge, experiencing concentration difficulties, having trouble falling or staying asleep, exhibiting a general state of irritability). Motor tension (e.g., restlessness, tiredness, shakiness, muscle tension). Problems Addressed  Anxiety Goals 1. Enhance ability to effectively cope with the full variety of life's worries and anxieties. 2. Learn and implement coping skills that result in a reduction of anxiety and worry, and improved daily functioning. Objective Learn to accept limitations in life and commit to tolerating, rather than avoiding, unpleasant emotions while accomplishing meaningful goals. Target Date: 2024-06-30 Frequency: Biweekly Progress: 10 Modality: individual Related  Interventions 1. Use techniques from Acceptance and Commitment Therapy to help client accept uncomfortable realities such as lack of complete control, imperfections, and uncertainty and tolerate unpleasant emotions and thoughts in order to accomplish value-consistent goals. Objective Learn and implement problem-solving strategies for realistically addressing worries. Target Date: 2024-06-30 Frequency: Biweekly Progress: 10 Modality: individual Related Interventions 1. Assign the client a homework exercise in which he/she problem-solves a current problem.  review, reinforce success, and provide corrective feedback toward improvement. 2. Teach the client problem-solving strategies involving specifically defining a problem, generating options for addressing it, evaluating the pros and cons of each option, selecting and implementing an optional action, and reevaluating and refining the action. Objective Learn and implement calming skills to reduce overall anxiety and manage anxiety symptoms. Target Date: 2024-06-30 Frequency: Biweekly Progress: 10 Modality: individual Related Interventions 1. Assign the client to read about progressive muscle relaxation and other calming strategies in relevant books or treatment manuals (e.g., Progressive Relaxation Training by Rodolfo Clan and Arvil Birks; Mastery of Your Anxiety and Worry: Workbook by Rodney Clamp). 2. Assign the client homework each session in which he/she practices relaxation exercises daily, gradually applying them progressively from non-anxiety-provoking to anxiety-provoking situations; review and reinforce success while providing corrective feedback toward improvement. 3. Teach the client calming/relaxation skills (e.g., applied relaxation, progressive muscle relaxation, cue controlled relaxation; mindful breathing; biofeedback) and how to discriminate better between relaxation and tension; teach the client how to apply these skills to his/her  daily life. 3. Reduce overall frequency, intensity, and duration of the anxiety so that daily functioning is not impaired. 4. Resolve the core conflict that is the source of anxiety. 5. Stabilize anxiety level while increasing ability to function on a daily basis. Diagnosis :    F41.1  Generalized Anxiety Disorder  Conditions For Discharge Achievement of treatment goals and objectives.      Nachmen Mansel, LCSW

## 2023-07-03 MED ORDER — AMPHETAMINE-DEXTROAMPHETAMINE 20 MG PO TABS: 20.0000 mg | ORAL_TABLET | Freq: Two times a day (BID) | ORAL | 0 refills | Status: DC

## 2023-07-05 ENCOUNTER — Telehealth: Payer: Self-pay

## 2023-07-05 NOTE — Telephone Encounter (Signed)
 Copied from CRM [id number]. Topic: Appointments - Scheduling Inquiry for Clinic >> Jul 01, 2023 12:11 PM Gary Jennings wrote: Reason for CRM: Patient was wondering if he could get squeezed in for a virtual visit with Dr. Therese Flash as soon as possible so he can go over medication and mental health.

## 2023-07-05 NOTE — Telephone Encounter (Signed)
 Called patient and he is okay with staying with the 07/27/23 appointment.  He is okay and has worked out things.  Dm/cma

## 2023-07-21 ENCOUNTER — Ambulatory Visit (INDEPENDENT_AMBULATORY_CARE_PROVIDER_SITE_OTHER): Admitting: Psychology

## 2023-07-21 DIAGNOSIS — F411 Generalized anxiety disorder: Secondary | ICD-10-CM

## 2023-07-21 NOTE — Progress Notes (Signed)
 [facility] Counselor/Therapist Progress Note  Patient ID: ANTHONYJAMES BARGAR, MRN: [id number],    Date: 07/21/2023  Time Spent: 9:00am-9:45am   45 minutes   Treatment Type: Individual Therapy  Reported Symptoms: stress, anxiety  Mental Status Exam: Appearance:  Casual     Behavior: Appropriate  Motor: Normal  Speech/Language:  Normal Rate  Affect: Appropriate  Mood: normal  Thought process: normal  Thought content:   WNL  Sensory/Perceptual disturbances:   WNL  Orientation: oriented to person, place, time/date, and situation  Attention: Good  Concentration: Good  Memory: WNL  Fund of knowledge:  Good  Insight:   Good  Judgment:  Good  Impulse Control: Good   Risk Assessment: Danger to Self:  No Self-injurious Behavior: No Danger to Others: No Duty to Warn:no Physical Aggression / Violence:No  Access to Firearms a concern: No  Gang Involvement:No   Subjective: Pt present for face-to-face individual therapy via video.  Pt consents to telehealth video session and is aware of limitations and benefits of virtual sessions. Location of pt: home Location of therapist: home office.   Pt states he went back on the Vibryd.  He has been on it for 18 days.   Pt is noticing improvement in mood and decrease in intrusive thoughts.   Pt is having trouble sleeping as a side effect of the Vibryd.    His sleep has been very disrupted.   Addressed pt's sleep issues.  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   Addressed the work stress. Pt talked about his relationship with his girlfriend.   They have been together for 4 years and pt bought an engagement ring.  He has not planned the proposal yet bc he is scared of the commitment.    Pt 's fears of commitment contribute to his critical thoughts about his girlfriend.   Addressed pt's commitment issues.  Helped him process his feelings and issues.   Identified that pt is afraid of what he may lose  regarding lifestyle changes.   Helped him identify what he would gain and what would be positive.   Provided supportive therapy.    Interventions: Cognitive Behavioral Therapy and Insight-Oriented  Diagnosis:  F41.1  Plan of Care: Recommend ongoing therapy.   Pt participated in setting treatment goals.  Pt wants to improve coping skills and reduce anxiety and intrusive thoughts.  Plan to meet every two weeks.   Pt agrees with treatment plan. Treatment Plan Client Abilities/Strengths  Pt is bright, engaging and motivated for therapy.  Client Treatment Preferences  Individual therapy.  Client Statement of Needs  Improve coping skills.  Symptoms  Autonomic hyperactivity (e.g., palpitations, shortness of breath, dry mouth, trouble swallowing, nausea, diarrhea). Excessive and/or unrealistic worry that is difficult to control occurring more days than not for at least 6 months about a number of events or activities. Hypervigilance (e.g., feeling constantly on edge, experiencing concentration difficulties, having trouble falling or staying asleep, exhibiting a general state of irritability). Motor tension (e.g., restlessness, tiredness, shakiness, muscle tension). Problems Addressed  Anxiety Goals 1. Enhance ability to effectively cope with the full variety of life's worries and anxieties. 2. Learn and implement coping skills that result in a reduction of anxiety and worry, and improved daily functioning. Objective Learn to accept limitations in life and commit to tolerating, rather than avoiding, unpleasant emotions while accomplishing meaningful goals. Target Date: 2024-06-30 Frequency: Biweekly Progress: 10 Modality: individual Related Interventions  1. Use techniques from Acceptance and Commitment Therapy to help client accept uncomfortable realities such as lack of complete control, imperfections, and uncertainty and tolerate unpleasant emotions and thoughts in order to accomplish  value-consistent goals. Objective Learn and implement problem-solving strategies for realistically addressing worries. Target Date: 2024-06-30 Frequency: Biweekly Progress: 10 Modality: individual Related Interventions 1. Assign the client a homework exercise in which he/she problem-solves a current problem.  review, reinforce success, and provide corrective feedback toward improvement. 2. Teach the client problem-solving strategies involving specifically defining a problem, generating options for addressing it, evaluating the pros and cons of each option, selecting and implementing an optional action, and reevaluating and refining the action. Objective Learn and implement calming skills to reduce overall anxiety and manage anxiety symptoms. Target Date: 2024-06-30 Frequency: Biweekly Progress: 10 Modality: individual Related Interventions 1. Assign the client to read about progressive muscle relaxation and other calming strategies in relevant books or treatment manuals (e.g., Progressive Relaxation Training by Rodolfo Clan and Arvil Birks; Mastery of Your Anxiety and Worry: Workbook by Rodney Clamp). 2. Assign the client homework each session in which he/she practices relaxation exercises daily, gradually applying them progressively from non-anxiety-provoking to anxiety-provoking situations; review and reinforce success while providing corrective feedback toward improvement. 3. Teach the client calming/relaxation skills (e.g., applied relaxation, progressive muscle relaxation, cue controlled relaxation; mindful breathing; biofeedback) and how to discriminate better between relaxation and tension; teach the client how to apply these skills to his/her daily life. 3. Reduce overall frequency, intensity, and duration of the anxiety so that daily functioning is not impaired. 4. Resolve the core conflict that is the source of anxiety. 5. Stabilize anxiety level while increasing ability to function on a  daily basis. Diagnosis :    F41.1  Generalized Anxiety Disorder  Conditions For Discharge Achievement of treatment goals and objectives.  Latania Bascomb, LCSW

## 2023-07-26 ENCOUNTER — Telehealth: Payer: Self-pay

## 2023-07-26 ENCOUNTER — Telehealth: Payer: Self-pay | Admitting: Family Medicine

## 2023-07-26 DIAGNOSIS — F411 Generalized anxiety disorder: Secondary | ICD-10-CM

## 2023-07-26 DIAGNOSIS — F902 Attention-deficit hyperactivity disorder, combined type: Secondary | ICD-10-CM

## 2023-07-26 NOTE — Telephone Encounter (Signed)
 Please see request below.  Copied from CRM [id number]. Topic: Clinical - Medication Question >> Jul 26, 2023 11:27 AM Luane Rumps D wrote: Reason for CRM: Patient would like to know if he could get his Vilazodone  HCl 20 MG TABS filled as the on brand instead of the generic.

## 2023-07-26 NOTE — Telephone Encounter (Signed)
 Can we prescribed the name brand Vybrid for him due to generic doesn't work as well?  He states tat he will need a PA for it. Dm/cma

## 2023-07-26 NOTE — Telephone Encounter (Unsigned)
 Copied from CRM [id number]. Topic: Clinical - Medication Refill >> Jul 26, 2023 11:24 AM Luane Rumps D wrote: Medication: amphetamine -dextroamphetamine  (ADDERALL) 20 MG tablet  Has the patient contacted their pharmacy? Yes, 0 refills (Agent: If no, request that the patient contact the pharmacy for the refill. If patient does not wish to contact the pharmacy document the reason why and proceed with request.) (Agent: If yes, when and what did the pharmacy advise?)  This is the patient's preferred pharmacy:  [id number] - [place], [place] - [address] AT [facility] [address] [address] [facility] [address] [place]  [address] Phone: [phone] Fax: [phone]  Is this the correct pharmacy for this prescription? Yes If no, delete pharmacy and type the correct one.   Has the prescription been filled recently? Yes  Is the patient out of the medication? No  Has the patient been seen for an appointment in the last year OR does the patient have an upcoming appointment? Yes  Can we respond through MyChart? Yes  Agent: Please be advised that Rx refills may take up to 3 business days. We ask that you follow-up with your pharmacy.

## 2023-07-27 ENCOUNTER — Ambulatory Visit: Admitting: Family Medicine

## 2023-07-27 MED ORDER — AMPHETAMINE-DEXTROAMPHETAMINE 20 MG PO TABS
20.0000 mg | ORAL_TABLET | Freq: Two times a day (BID) | ORAL | 0 refills | Status: DC
Start: 2023-07-27 — End: 2023-08-11

## 2023-07-27 NOTE — Telephone Encounter (Signed)
 Requesting: amphetamine -dextroamphetamine  (ADDERALL) 20 MG tablet  Last Visit: 06/08/2023 Next Visit: 08/11/2023 Last Refill: 07/03/2023  Please Advise

## 2023-07-27 NOTE — Telephone Encounter (Signed)
 Called patient will be scheduled for 08/11/23 @ 11:00 am. Dm/cma

## 2023-08-09 ENCOUNTER — Other Ambulatory Visit: Payer: Self-pay | Admitting: Family Medicine

## 2023-08-09 DIAGNOSIS — F902 Attention-deficit hyperactivity disorder, combined type: Secondary | ICD-10-CM

## 2023-08-09 NOTE — Telephone Encounter (Signed)
 Rx already sent  to pharmacy.  Unable to decline RX. Dm/cma

## 2023-08-09 NOTE — Telephone Encounter (Signed)
 Copied from CRM [id number]. Topic: Clinical - Medication Refill >> Aug 09, 2023 11:39 AM Adonis Hoot wrote: Medication: amphetamine -dextroamphetamine  (ADDERALL) 20 MG tablet  Has the patient contacted their pharmacy? No (Agent: If no, request that the patient contact the pharmacy for the refill. If patient does not wish to contact the pharmacy document the reason why and proceed with request.) (Agent: If yes, when and what did the pharmacy advise?)  This is the patient's preferred pharmacy:  [id number] - [place], [place] - [address] [facility] [address] [place] [address] Phone: [phone] Fax: [phone]    Is this the correct pharmacy for this prescription? Yes If no, delete pharmacy and type the correct one.   Has the prescription been filled recently? Yes  Is the patient out of the medication? No(few left)  Has the patient been seen for an appointment in the last year OR does the patient have an upcoming appointment? Yes  Can we respond through MyChart? Yes  Agent: Please be advised that Rx refills may take up to 3 business days. We ask that you follow-up with your pharmacy.

## 2023-08-11 ENCOUNTER — Encounter: Payer: Self-pay | Admitting: Family Medicine

## 2023-08-11 ENCOUNTER — Telehealth (INDEPENDENT_AMBULATORY_CARE_PROVIDER_SITE_OTHER): Admitting: Family Medicine

## 2023-08-11 DIAGNOSIS — F411 Generalized anxiety disorder: Secondary | ICD-10-CM

## 2023-08-11 DIAGNOSIS — R0609 Other forms of dyspnea: Secondary | ICD-10-CM

## 2023-08-11 DIAGNOSIS — F902 Attention-deficit hyperactivity disorder, combined type: Secondary | ICD-10-CM

## 2023-08-11 MED ORDER — ALBUTEROL SULFATE HFA 108 (90 BASE) MCG/ACT IN AERS: INHALATION_SPRAY | RESPIRATORY_TRACT | 2 refills | Status: AC

## 2023-08-11 MED ORDER — AMPHETAMINE-DEXTROAMPHETAMINE 20 MG PO TABS
20.0000 mg | ORAL_TABLET | Freq: Two times a day (BID) | ORAL | 0 refills | Status: DC
Start: 2023-08-11 — End: 2023-08-24

## 2023-08-11 MED ORDER — VIIBRYD 20 MG PO TABS: 20.0000 mg | ORAL_TABLET | Freq: Every day | ORAL | 3 refills | Status: DC

## 2023-08-11 NOTE — Assessment & Plan Note (Signed)
 I will write a prescription for name-brand Viibryd  20 mg daily. I recommend we follow-up in 6 weeks to see if this dose is working for him.

## 2023-08-11 NOTE — Progress Notes (Signed)
 [facility] [address] [place] [address] Dept: [phone] Dept Fax: [phone]  Virtual Video Visit  I connected with Roise Cleaver Orcutt on 08/11/23 at 11:00 AM EDT by a video enabled telemedicine application and verified that I am speaking with the correct person using two identifiers.  Location patient: [facility] Location provider: Clinic Persons participating in the virtual visit: Patient, Provider  I discussed the limitations of evaluation and management by telemedicine and the availability of in person appointments. The patient expressed understanding and agreed to proceed.  Chief Complaint  Patient presents with   ADHD    F/u to discuss getting the name brand Vybrid and refills.    SUBJECTIVE:  HPI: LUNDEN MCLEISH is a 33 y.o. male who presents to discuss management of his anxiety. At his last visit on 4/9, we discussed restarting him on vilazodone  (Viibryd ), as he felt this had significantly improved his anxiety previously. He was unsure at the time about making the move. He did start counseling. He then went onto the medication. Initially, he was given name-brand Viibryd . However, at his refill, he was switched to a generic. He found he developed issues with poor sleep, increased agitation, and increased thirst with the medication change. He notes he contacted his medical insurance and they would be willign to pay for the name brand for him. He would like to switch to this.  Patient Active Problem List   Diagnosis Date Noted   Seborrhea 12/09/2022   At risk for sleep apnea 01/28/2022   Class 1 obesity due to excess calories without serious comorbidity with body mass index (BMI) of 32.0 to 32.9 in adult 01/28/2022   Penile lesion 07/16/2021   Tinea capitis 07/16/2021   Patellar tendinitis, left knee 06/23/2020   Loose body in knee, right knee 06/23/2020   Generalized anxiety disorder 09/06/2016   GERD  01/03/2008   Attention deficit disorder 04/17/2007   History reviewed. No pertinent surgical history. Family History  Problem Relation Age of Onset   COPD Father    Stroke Maternal Grandfather    Heart disease Maternal Grandfather    Stroke Paternal Grandfather    Social History   Tobacco Use   Smoking status: Never   Smokeless tobacco: Former  Building services engineer status: Never Used  Substance Use Topics   Alcohol use: Yes    Comment: social   Drug use: No    Current Outpatient Medications:    Selenium  Sulfide 2.25 % SHAM, Use as directed on scalp 2-3 times a week, Disp: 180 mL, Rfl: 11   triamcinolone  cream (KENALOG ) 0.1 %, Apply 1 Application topically 2 (two) times daily., Disp: 30 g, Rfl: 0   VIIBRYD  20 MG TABS, Take 1 tablet (20 mg total) by mouth daily., Disp: 90 tablet, Rfl: 3   Vilazodone  HCl 20 MG TABS, Take 1 tablet (20 mg total) by mouth daily at 12 noon., Disp: 30 tablet, Rfl: 3   albuterol  (VENTOLIN  HFA) 108 (90 Base) MCG/ACT inhaler, Inhale 2 puffs into the lungs 15 min. prior to exercise, Disp: 8 g, Rfl: 2   amphetamine -dextroamphetamine  (ADDERALL) 20 MG tablet, Take 1 tablet (20 mg total) by mouth 2 (two) times daily., Disp: 30 tablet, Rfl: 0 Allergies  Allergen Reactions   Lexapro  [Escitalopram  Oxalate]     Suicidal thoughts   Ceftin  [Cefuroxime  Axetil]     n/v   Wellbutrin  [Bupropion ]     Anxiety, crying spells  ROS: See pertinent positives and negatives per HPI.  OBSERVATIONS/OBJECTIVE:  VITALS per patient if applicable: Today's Vitals   08/11/23 1053  Weight: 198 lb (89.8 kg)  Height: 5' 8 (1.727 m)   Body mass index is 30.11 kg/m.   GENERAL: Alert and oriented. Appears well and in no acute distress. PSYCH/NEURO: Pleasant and cooperative. No obvious depression or anxiety. Speech and thought processing grossly intact.  ASSESSMENT AND PLAN:  Problem List Items Addressed This Visit       Other   Attention deficit disorder   Stable. Due  for renewal of Adderall.      Relevant Medications   amphetamine -dextroamphetamine  (ADDERALL) 20 MG tablet   Generalized anxiety disorder   I will write a prescription for name-brand Viibryd  20 mg daily. I recommend we follow-up in 6 weeks to see if this dose is working for him.      Relevant Medications   VIIBRYD  20 MG TABS   Other Visit Diagnoses       Dyspnea on exertion       I will refill his albuterol  for now.   Relevant Medications   albuterol  (VENTOLIN  HFA) 108 (90 Base) MCG/ACT inhaler        I discussed the assessment and treatment plan with the patient. The patient was provided an opportunity to ask questions and all were answered. The patient agreed with the plan and demonstrated an understanding of the instructions.   The patient was advised to call back or seek an in-person evaluation if the symptoms worsen or if the condition fails to improve as anticipated.  Return in about 6 weeks (around 09/22/2023) for Reassessment.   Graig Lawyer, MD

## 2023-08-11 NOTE — Assessment & Plan Note (Signed)
 Stable. Due for renewal of Adderall.

## 2023-08-22 ENCOUNTER — Ambulatory Visit (INDEPENDENT_AMBULATORY_CARE_PROVIDER_SITE_OTHER): Admitting: Psychology

## 2023-08-22 DIAGNOSIS — F411 Generalized anxiety disorder: Secondary | ICD-10-CM

## 2023-08-22 NOTE — Progress Notes (Signed)
 [facility] Counselor/Therapist Progress Note  Patient ID: RAED SCHALK, MRN: [id number],    Date: 08/22/2023  Time Spent: 3:00pm-3:50pm   50 minutes   Treatment Type: Individual Therapy  Reported Symptoms: stress, anxiety  Mental Status Exam: Appearance:  Casual     Behavior: Appropriate  Motor: Normal  Speech/Language:  Normal Rate  Affect: Appropriate  Mood: normal  Thought process: normal  Thought content:   WNL  Sensory/Perceptual disturbances:   WNL  Orientation: oriented to person, place, time/date, and situation  Attention: Good  Concentration: Good  Memory: WNL  Fund of knowledge:  Good  Insight:   Good  Judgment:  Good  Impulse Control: Good   Risk Assessment: Danger to Self:  No Self-injurious Behavior: No Danger to Others: No Duty to Warn:no Physical Aggression / Violence:No  Access to Firearms a concern: No  Gang Involvement:No   Subjective: Pt present for face-to-face individual therapy via video.  Pt consents to telehealth video session and is aware of limitations and benefits of virtual sessions. Location of pt: home Location of therapist: home office.   Pt talked about his anxiety.  It was very bad last week bc he had an issue with his medication.   Pt is still having some of the same thoughts about his girlfriend.   Pt is questioning the compatability of the relationship bc they are so different.  Pt is very social and has a lot of friends.   His girlfriend [name] doesn't like to be around many people.  Pt has a lot of friends and [name] does not have any friends.    Pt finds himself in no win situations often.   Pt is not sure he can be with [name] long term.  They have been together for 4 years so it would be a difficult decision to end the relationship.   Pt cares about [name] but they both wonder at times if they are holding each other back and are just comfortable with each other.   Pt feels like [name] is sucking the life out of him.   Helped pt process his feelings and relationship dynamics.  Worked on Designer, multimedia.  Provided supportive therapy.    Interventions: Cognitive Behavioral Therapy and Insight-Oriented  Diagnosis:  F41.1  Plan of Care: Recommend ongoing therapy.   Pt participated in setting treatment goals.  Pt wants to improve coping skills and reduce anxiety and intrusive thoughts.  Plan to meet every two weeks.   Pt agrees with treatment plan. Treatment Plan Client Abilities/Strengths  Pt is bright, engaging and motivated for therapy.  Client Treatment Preferences  Individual therapy.  Client Statement of Needs  Improve coping skills.  Symptoms  Autonomic hyperactivity (e.g., palpitations, shortness of breath, dry mouth, trouble swallowing, nausea, diarrhea). Excessive and/or unrealistic worry that is difficult to control occurring more days than not for at least 6 months about a number of events or activities. Hypervigilance (e.g., feeling constantly on edge, experiencing concentration difficulties, having trouble falling or staying asleep, exhibiting a general state of irritability). Motor tension (e.g., restlessness, tiredness, shakiness, muscle tension). Problems Addressed  Anxiety Goals 1. Enhance ability to effectively cope with the full variety of life's worries and anxieties. 2. Learn and implement coping skills that result in a reduction of anxiety and worry, and improved daily functioning. Objective Learn to accept limitations in life and commit to tolerating, rather than avoiding, unpleasant emotions while accomplishing meaningful goals. Target Date: 2024-06-30 Frequency: Biweekly Progress: 10 Modality: individual  Related Interventions 1. Use techniques from Acceptance and Commitment Therapy to help client accept uncomfortable realities such as lack of complete control, imperfections, and uncertainty and tolerate unpleasant emotions and thoughts in order to accomplish value-consistent  goals. Objective Learn and implement problem-solving strategies for realistically addressing worries. Target Date: 2024-06-30 Frequency: Biweekly Progress: 10 Modality: individual Related Interventions 1. Assign the client a homework exercise in which he/she problem-solves a current problem.  review, reinforce success, and provide corrective feedback toward improvement. 2. Teach the client problem-solving strategies involving specifically defining a problem, generating options for addressing it, evaluating the pros and cons of each option, selecting and implementing an optional action, and reevaluating and refining the action. Objective Learn and implement calming skills to reduce overall anxiety and manage anxiety symptoms. Target Date: 2024-06-30 Frequency: Biweekly Progress: 10 Modality: individual Related Interventions 1. Assign the client to read about progressive muscle relaxation and other calming strategies in relevant books or treatment manuals (e.g., Progressive Relaxation Training by Thornell and Elmer; Mastery of Your Anxiety and Worry: Workbook by Richarda armin Given). 2. Assign the client homework each session in which he/she practices relaxation exercises daily, gradually applying them progressively from non-anxiety-provoking to anxiety-provoking situations; review and reinforce success while providing corrective feedback toward improvement. 3. Teach the client calming/relaxation skills (e.g., applied relaxation, progressive muscle relaxation, cue controlled relaxation; mindful breathing; biofeedback) and how to discriminate better between relaxation and tension; teach the client how to apply these skills to his/her daily life. 3. Reduce overall frequency, intensity, and duration of the anxiety so that daily functioning is not impaired. 4. Resolve the core conflict that is the source of anxiety. 5. Stabilize anxiety level while increasing ability to function on a daily  basis. Diagnosis :    F41.1  Generalized Anxiety Disorder  Conditions For Discharge Achievement of treatment goals and objectives.  Alyra Patty, LCSW

## 2023-08-24 ENCOUNTER — Other Ambulatory Visit: Payer: Self-pay | Admitting: Family Medicine

## 2023-08-24 DIAGNOSIS — F902 Attention-deficit hyperactivity disorder, combined type: Secondary | ICD-10-CM

## 2023-08-24 MED ORDER — AMPHETAMINE-DEXTROAMPHETAMINE 20 MG PO TABS: 20.0000 mg | ORAL_TABLET | Freq: Two times a day (BID) | ORAL | 0 refills | Status: DC

## 2023-08-24 NOTE — Telephone Encounter (Signed)
 Refill request for  Adderall 20 mg LR 08/11/23, #30, 0 rf b (2 week supply) LOV  08/11/23 FOV  none scheduled.   Was only given a 2 week supply   Please review and advise.  Thanks. Dm/cma

## 2023-08-24 NOTE — Telephone Encounter (Signed)
 Prescription Request  08/24/2023  LOV: 06/08/2023  What is the name of the medication or equipment? amphetamine -dextroamphetamine  (ADDERALL) 20 MG tablet [[id number]]   Have you contacted your pharmacy to request a refill? No   Which pharmacy would you like this sent to?  [id number] - [place],  - [address] AT [facility] [address] [place] [address] Phone: [phone] Fax: [phone]     Patient notified that their request is being sent to the clinical staff for review and that they should receive a response within 2 business days.   Please advise at Mobile [phone] (mobile)

## 2023-09-26 ENCOUNTER — Other Ambulatory Visit: Payer: Self-pay | Admitting: Family Medicine

## 2023-09-26 DIAGNOSIS — R0609 Other forms of dyspnea: Secondary | ICD-10-CM

## 2023-09-26 DIAGNOSIS — F902 Attention-deficit hyperactivity disorder, combined type: Secondary | ICD-10-CM

## 2023-09-26 NOTE — Telephone Encounter (Signed)
 Copied from CRM [id number]. Topic: Clinical - Medication Refill >> Sep 26, 2023  8:50 AM Pinkey ORN wrote: Medication: amphetamine -dextroamphetamine  (ADDERALL) 20 MG tablet  Has the patient contacted their pharmacy? Yes (Agent: If no, request that the patient contact the pharmacy for the refill. If patient does not wish to contact the pharmacy document the reason why and proceed with request.) (Agent: If yes, when and what did the pharmacy advise?)  This is the patient's preferred pharmacy:  [id number] - [place], [place] - [address] AT [facility] [address] [place] [address] Phone: [phone] Fax: [phone]  Is this the correct pharmacy for this prescription? Yes If no, delete pharmacy and type the correct one.   Has the prescription been filled recently? No  Is the patient out of the medication? Yes  Has the patient been seen for an appointment in the last year OR does the patient have an upcoming appointment? Yes  Can we respond through MyChart? Yes  Agent: Please be advised that Rx refills may take up to 3 business days. We ask that you follow-up with your pharmacy.

## 2023-09-27 ENCOUNTER — Ambulatory Visit (INDEPENDENT_AMBULATORY_CARE_PROVIDER_SITE_OTHER): Admitting: Psychology

## 2023-09-27 DIAGNOSIS — F411 Generalized anxiety disorder: Secondary | ICD-10-CM | POA: Diagnosis not present

## 2023-09-27 MED ORDER — AMPHETAMINE-DEXTROAMPHETAMINE 20 MG PO TABS: 20.0000 mg | ORAL_TABLET | Freq: Two times a day (BID) | ORAL | 0 refills | Status: DC

## 2023-09-27 NOTE — Progress Notes (Signed)
 [facility] Counselor/Therapist Progress Note  Patient ID: Gary Jennings, MRN: [id number],    Date: 09/27/2023  Time Spent: 5:00pm-5:45pm   45 minutes   Treatment Type: Individual Therapy  Reported Symptoms: stress, anxiety  Mental Status Exam: Appearance:  Casual     Behavior: Appropriate  Motor: Normal  Speech/Language:  Normal Rate  Affect: Appropriate  Mood: normal  Thought process: normal  Thought content:   WNL  Sensory/Perceptual disturbances:   WNL  Orientation: oriented to person, place, time/date, and situation  Attention: Good  Concentration: Good  Memory: WNL  Fund of knowledge:  Good  Insight:   Good  Judgment:  Good  Impulse Control: Good   Risk Assessment: Danger to Self:  No Self-injurious Behavior: No Danger to Others: No Duty to Warn:no Physical Aggression / Violence:No  Access to Firearms a concern: No  Gang Involvement:No   Subjective: Pt present for face-to-face individual therapy via video.  Pt consents to telehealth video session and is aware of limitations and benefits of virtual sessions. Location of pt: home Location of therapist: home office.   Pt talked about his anxiety.  He has adjusted his medication and his anxiety has improved.     Pt is still having some of the same thoughts about his girlfriend.   Pt is questioning the compatability of the relationship bc they are so different.   Pt has had talks with [name] about not being happy in the relationship.   She is more committed to the relationship than pt is.   Pt connected with an ex girlfriend online.  They have just messaged online as friends but pt feels badly about it.   Addressed how that behavior does not serve him well.     Addressed the patterns that pt has in his relationship with women.   He typically compares and has trouble with commitment.   He tends to want to do the things that are most fun for him all of the time instead of compromising in a relationship.     Worked with pt on insight into his patterns. Helped pt process his feelings and relationship dynamics.  Worked on Designer, multimedia.  Pt talked about having a goal to eat more healthy and exercise.  He feels badly that he has gained weight the past couple of years.   Provided supportive therapy.    Interventions: Cognitive Behavioral Therapy and Insight-Oriented  Diagnosis:  F41.1  Plan of Care: Recommend ongoing therapy.   Pt participated in setting treatment goals.  Pt wants to improve coping skills and reduce anxiety and intrusive thoughts.  Plan to meet every two weeks.   Pt agrees with treatment plan. Treatment Plan Client Abilities/Strengths  Pt is bright, engaging and motivated for therapy.  Client Treatment Preferences  Individual therapy.  Client Statement of Needs  Improve coping skills.  Symptoms  Autonomic hyperactivity (e.g., palpitations, shortness of breath, dry mouth, trouble swallowing, nausea, diarrhea). Excessive and/or unrealistic worry that is difficult to control occurring more days than not for at least 6 months about a number of events or activities. Hypervigilance (e.g., feeling constantly on edge, experiencing concentration difficulties, having trouble falling or staying asleep, exhibiting a general state of irritability). Motor tension (e.g., restlessness, tiredness, shakiness, muscle tension). Problems Addressed  Anxiety Goals 1. Enhance ability to effectively cope with the full variety of life's worries and anxieties. 2. Learn and implement coping skills that result in a reduction of anxiety and worry, and improved daily functioning.  Objective Learn to accept limitations in life and commit to tolerating, rather than avoiding, unpleasant emotions while accomplishing meaningful goals. Target Date: 2024-06-30 Frequency: Biweekly Progress: 10 Modality: individual Related Interventions 1. Use techniques from Acceptance and Commitment Therapy to help client accept  uncomfortable realities such as lack of complete control, imperfections, and uncertainty and tolerate unpleasant emotions and thoughts in order to accomplish value-consistent goals. Objective Learn and implement problem-solving strategies for realistically addressing worries. Target Date: 2024-06-30 Frequency: Biweekly Progress: 10 Modality: individual Related Interventions 1. Assign the client a homework exercise in which he/she problem-solves a current problem.  review, reinforce success, and provide corrective feedback toward improvement. 2. Teach the client problem-solving strategies involving specifically defining a problem, generating options for addressing it, evaluating the pros and cons of each option, selecting and implementing an optional action, and reevaluating and refining the action. Objective Learn and implement calming skills to reduce overall anxiety and manage anxiety symptoms. Target Date: 2024-06-30 Frequency: Biweekly Progress: 10 Modality: individual Related Interventions 1. Assign the client to read about progressive muscle relaxation and other calming strategies in relevant books or treatment manuals (e.g., Progressive Relaxation Training by Thornell and Elmer; Mastery of Your Anxiety and Worry: Workbook by Richarda armin Given). 2. Assign the client homework each session in which he/she practices relaxation exercises daily, gradually applying them progressively from non-anxiety-provoking to anxiety-provoking situations; review and reinforce success while providing corrective feedback toward improvement. 3. Teach the client calming/relaxation skills (e.g., applied relaxation, progressive muscle relaxation, cue controlled relaxation; mindful breathing; biofeedback) and how to discriminate better between relaxation and tension; teach the client how to apply these skills to his/her daily life. 3. Reduce overall frequency, intensity, and duration of the anxiety so that daily  functioning is not impaired. 4. Resolve the core conflict that is the source of anxiety. 5. Stabilize anxiety level while increasing ability to function on a daily basis. Diagnosis :    F41.1  Generalized Anxiety Disorder  Conditions For Discharge Achievement of treatment goals and objectives.  Renita Brocks, LCSW

## 2023-09-27 NOTE — Telephone Encounter (Signed)
 Refill request for   Adderall 20 mg LR  08/24/23, #60, 0 rf LOV 06/08/23 FOV  none scheduled.    Please review and advise.   Thanks. Dm/cma

## 2023-09-27 NOTE — Telephone Encounter (Signed)
 08/24/23 60 tablets/0 RF

## 2023-10-18 ENCOUNTER — Ambulatory Visit (INDEPENDENT_AMBULATORY_CARE_PROVIDER_SITE_OTHER): Admitting: Psychology

## 2023-10-18 DIAGNOSIS — F411 Generalized anxiety disorder: Secondary | ICD-10-CM | POA: Diagnosis not present

## 2023-10-18 NOTE — Progress Notes (Signed)
 [facility] Counselor/Therapist Progress Note  Patient ID: Gary Jennings, MRN: [id number],    Date: 10/18/2023  Time Spent: 5:00pm-5:45pm   45 minutes   Treatment Type: Individual Therapy  Reported Symptoms: stress, anxiety  Mental Status Exam: Appearance:  Casual     Behavior: Appropriate  Motor: Normal  Speech/Language:  Normal Rate  Affect: Appropriate  Mood: normal  Thought process: normal  Thought content:   WNL  Sensory/Perceptual disturbances:   WNL  Orientation: oriented to person, place, time/date, and situation  Attention: Good  Concentration: Good  Memory: WNL  Fund of knowledge:  Good  Insight:   Good  Judgment:  Good  Impulse Control: Good   Risk Assessment: Danger to Self:  No Self-injurious Behavior: No Danger to Others: No Duty to Warn:no Physical Aggression / Violence:No  Access to Firearms a concern: No  Gang Involvement:No   Subjective: Pt present for face-to-face individual therapy via video.  Pt consents to telehealth video session and is aware of limitations and benefits of virtual sessions. Location of pt: home Location of therapist: home office.   Pt states not much has changed since the last session.   Pt is still having some of the same thoughts about his girlfriend.   Pt is questioning the compatability of the relationship bc they are so different.  [name] has a lot of social anxiety but pt is very social and likes to be with groups of people.   Pt is not happy with the lack of intimacy in the relationship.   Pt and [name] had a talk about their relationship.   [name] does not want pt to waste her time if he does not ultimately want to be with her.  Pt compares [name] to other women and makes up stories in his head about how he would be happier with someone else.   Addressed the patterns that pt has in his relationship with women.   He typically compares and has trouble with commitment.  Worked with pt on insight into his  patterns. Helped pt process his feelings and relationship dynamics.  Worked on Designer, multimedia.  Pt talked about having a goal to eat more healthy and exercise but he lacks motivation to improve his health.   Worked on setting small attainable goals and taking small action steps. Provided supportive therapy.    Interventions: Cognitive Behavioral Therapy and Insight-Oriented  Diagnosis:  F41.1  Plan of Care: Recommend ongoing therapy.   Pt participated in setting treatment goals.  Pt wants to improve coping skills and reduce anxiety and intrusive thoughts.  Plan to meet every two weeks.   Pt agrees with treatment plan. Treatment Plan Client Abilities/Strengths  Pt is bright, engaging and motivated for therapy.  Client Treatment Preferences  Individual therapy.  Client Statement of Needs  Improve coping skills.  Symptoms  Autonomic hyperactivity (e.g., palpitations, shortness of breath, dry mouth, trouble swallowing, nausea, diarrhea). Excessive and/or unrealistic worry that is difficult to control occurring more days than not for at least 6 months about a number of events or activities. Hypervigilance (e.g., feeling constantly on edge, experiencing concentration difficulties, having trouble falling or staying asleep, exhibiting a general state of irritability). Motor tension (e.g., restlessness, tiredness, shakiness, muscle tension). Problems Addressed  Anxiety Goals 1. Enhance ability to effectively cope with the full variety of life's worries and anxieties. 2. Learn and implement coping skills that result in a reduction of anxiety and worry, and improved daily functioning. Objective Learn to accept  limitations in life and commit to tolerating, rather than avoiding, unpleasant emotions while accomplishing meaningful goals. Target Date: 2024-06-30 Frequency: Biweekly Progress: 10 Modality: individual Related Interventions 1. Use techniques from Acceptance and Commitment Therapy to help  client accept uncomfortable realities such as lack of complete control, imperfections, and uncertainty and tolerate unpleasant emotions and thoughts in order to accomplish value-consistent goals. Objective Learn and implement problem-solving strategies for realistically addressing worries. Target Date: 2024-06-30 Frequency: Biweekly Progress: 10 Modality: individual Related Interventions 1. Assign the client a homework exercise in which he/she problem-solves a current problem.  review, reinforce success, and provide corrective feedback toward improvement. 2. Teach the client problem-solving strategies involving specifically defining a problem, generating options for addressing it, evaluating the pros and cons of each option, selecting and implementing an optional action, and reevaluating and refining the action. Objective Learn and implement calming skills to reduce overall anxiety and manage anxiety symptoms. Target Date: 2024-06-30 Frequency: Biweekly Progress: 10 Modality: individual Related Interventions 1. Assign the client to read about progressive muscle relaxation and other calming strategies in relevant books or treatment manuals (e.g., Progressive Relaxation Training by Thornell and Elmer; Mastery of Your Anxiety and Worry: Workbook by Richarda armin Given). 2. Assign the client homework each session in which he/she practices relaxation exercises daily, gradually applying them progressively from non-anxiety-provoking to anxiety-provoking situations; review and reinforce success while providing corrective feedback toward improvement. 3. Teach the client calming/relaxation skills (e.g., applied relaxation, progressive muscle relaxation, cue controlled relaxation; mindful breathing; biofeedback) and how to discriminate better between relaxation and tension; teach the client how to apply these skills to his/her daily life. 3. Reduce overall frequency, intensity, and duration of the anxiety so  that daily functioning is not impaired. 4. Resolve the core conflict that is the source of anxiety. 5. Stabilize anxiety level while increasing ability to function on a daily basis. Diagnosis :    F41.1  Generalized Anxiety Disorder  Conditions For Discharge Achievement of treatment goals and objectives.  Alixis Harmon, LCSW

## 2023-10-24 ENCOUNTER — Other Ambulatory Visit: Payer: Self-pay | Admitting: Family Medicine

## 2023-10-24 DIAGNOSIS — F902 Attention-deficit hyperactivity disorder, combined type: Secondary | ICD-10-CM

## 2023-10-24 NOTE — Telephone Encounter (Signed)
 Copied from CRM #[id number]. Topic: Clinical - Medication Refill >> Oct 24, 2023 10:59 AM Maisie BROCKS wrote: Medication: ADDERALL   Has the patient contacted their pharmacy? Yes Per pharmacy,he has to contact the office.   Also, pt will be going out of town and would like a refill sent in before usual time/   This is the patient's preferred pharmacy:  [id number] - [place], [place] - [address] AT [facility] OF [address] & [address] [place] [address] Phone: [phone] Fax: [phone]   Is this the correct pharmacy for this prescription? Yes If no, delete pharmacy and type the correct one.   Has the prescription been filled recently? Yes  Is the patient out of the medication? No  Has the patient been seen for an appointment in the last year OR does the patient have an upcoming appointment? No  Can we respond through MyChart? Yes  Agent: Please be advised that Rx refills may take up to 3 business days. We ask that you follow-up with your pharmacy.

## 2023-10-24 NOTE — Telephone Encounter (Signed)
 09/27/23 60 tabs/0 RF

## 2023-10-25 MED ORDER — AMPHETAMINE-DEXTROAMPHETAMINE 20 MG PO TABS: 20.0000 mg | ORAL_TABLET | Freq: Two times a day (BID) | ORAL | 0 refills | Status: DC

## 2023-10-25 NOTE — Telephone Encounter (Signed)
 Refill request for  Adderall 20 mg LR 09/27/23, #60, 0 rf LOV  06/18/23 FOV  none scheduled.   Please review and advise.  Thanks. Dm/cma

## 2023-10-25 NOTE — Telephone Encounter (Signed)
 Scheduled for 11/08/23 @ 2 pm. Dm/cma

## 2023-10-26 ENCOUNTER — Other Ambulatory Visit: Payer: Self-pay

## 2023-10-26 DIAGNOSIS — F902 Attention-deficit hyperactivity disorder, combined type: Secondary | ICD-10-CM

## 2023-10-26 MED ORDER — AMPHETAMINE-DEXTROAMPHETAMINE 20 MG PO TABS: 20.0000 mg | ORAL_TABLET | Freq: Two times a day (BID) | ORAL | 0 refills | Status: DC

## 2023-10-26 NOTE — Telephone Encounter (Signed)
 Patient called and verified that Gary Jennings has the Adderall.  Dm/cma

## 2023-10-26 NOTE — Telephone Encounter (Signed)
 Copied from CRM #[id number]. Topic: Clinical - Prescription Issue >> Oct 26, 2023 11:44 AM Drema MATSU wrote: Reason for CRM: Patient needs prescription transferred to [facility]. [facility] stated that Adderall is on [facility] back order.

## 2023-11-08 ENCOUNTER — Encounter: Payer: Self-pay | Admitting: Family Medicine

## 2023-11-08 ENCOUNTER — Ambulatory Visit: Admitting: Family Medicine

## 2023-11-08 VITALS — BP 120/72 | HR 49 | Temp 97.4°F | Ht 68.0 in | Wt 199.4 lb

## 2023-11-08 DIAGNOSIS — F902 Attention-deficit hyperactivity disorder, combined type: Secondary | ICD-10-CM

## 2023-11-08 DIAGNOSIS — F411 Generalized anxiety disorder: Secondary | ICD-10-CM | POA: Diagnosis not present

## 2023-11-08 DIAGNOSIS — R21 Rash and other nonspecific skin eruption: Secondary | ICD-10-CM

## 2023-11-08 NOTE — Assessment & Plan Note (Signed)
 Stable. Continue  Adderall 20 mg bid.

## 2023-11-08 NOTE — Progress Notes (Signed)
 [facility] [address] [place] [address] Dept: [phone] Dept Fax: [phone]  Chronic Care Office Visit  Subjective:    Patient ID: Gary Jennings, male    DOB: Jul 30, 1990, 33 y.o..   MRN: [id number]  Chief Complaint  Patient presents with   ADHD    ADHD f/u meds.  No concerns.     History of Present Illness:  Patient is in today for reassessment of chronic medical issues.  Mr. Pittsley has a history of ADHD, diagnosed in childhood. He is currently managed on Adderall 20 mg bid. He feels his focus is good and wants to continue this current dose.   Mr. Sharp has a history of anxiety. We restarted him on vilazodone  (Viibryd ) 20 mg daily this summer. He feels his anxiety is improved.   Mr. Tesch notes issues with redness to his face. He feels this is not a sunburn. This has been present for quite some time and occurs in both summer and winter.  Past Medical History: Patient Active Problem List   Diagnosis Date Noted   Seborrhea 12/09/2022   At risk for sleep apnea 01/28/2022   Class 1 obesity due to excess calories without serious comorbidity with body mass index (BMI) of 32.0 to 32.9 in adult 01/28/2022   Penile lesion 07/16/2021   Tinea capitis 07/16/2021   Patellar tendinitis, left knee 06/23/2020   Loose body in knee, right knee 06/23/2020   Generalized anxiety disorder 09/06/2016   GERD 01/03/2008   Attention deficit disorder 04/17/2007   History reviewed. No pertinent surgical history. Family History  Problem Relation Age of Onset   COPD Father    Stroke Maternal Grandfather    Heart disease Maternal Grandfather    Stroke Paternal Grandfather    Outpatient Medications Prior to Visit  Medication Sig Dispense Refill   albuterol  (VENTOLIN  HFA) 108 (90 Base) MCG/ACT inhaler Inhale 2 puffs into the lungs 15 min. prior to exercise 8 g 2   amphetamine -dextroamphetamine  (ADDERALL) 20 MG tablet Take 1  tablet (20 mg total) by mouth 2 (two) times daily. 60 tablet 0   triamcinolone  cream (KENALOG ) 0.1 % Apply 1 Application topically 2 (two) times daily. 30 g 0   VIIBRYD  20 MG TABS Take 1 tablet (20 mg total) by mouth daily. 90 tablet 3   Selenium  Sulfide 2.25 % SHAM Use as directed on scalp 2-3 times a week (Patient not taking: Reported on 11/08/2023) 180 mL 11   Vilazodone  HCl 20 MG TABS Take 1 tablet (20 mg total) by mouth daily at 12 noon. 30 tablet 3   No facility-administered medications prior to visit.   Allergies  Allergen Reactions   Lexapro  [Escitalopram  Oxalate]     Suicidal thoughts   Ceftin  [Cefuroxime  Axetil]     n/v   Wellbutrin  [Bupropion ]     Anxiety, crying spells   Objective:   Today's Vitals   11/08/23 1405  BP: 120/72  Pulse: (!) 49  Temp: (!) 97.4 F (36.3 C)  TempSrc: Temporal  SpO2: 97%  Weight: 199 lb 6.4 oz (90.4 kg)  Height: 5' 8 (1.727 m)   Body mass index is 30.32 kg/m.   General: Well developed, well nourished. No acute distress. Skin: Both cheeks have redness, with sparing under the eyes and of the forehead. There are a few   telangectasias present. No scaliness, or papules noted. The skin of the nose is not thickened. Psych: Alert and oriented. Normal mood and  affect.  Health Maintenance Due  Topic Date Due   Hepatitis C Screening  Never done   Hepatitis B Vaccines 19-59 Average Risk (1 of 3 - 19+ 3-dose series) Never done   HPV VACCINES (1 - 3-dose SCDM series) Never done     Assessment & Plan:   Problem List Items Addressed This Visit       Other   Attention deficit disorder - Primary   Stable. Continue Adderall 20 mg bid.      Generalized anxiety disorder   Improved. Continue vilazodone  (Viibryd ) 20 mg daily.       Other Visit Diagnoses       Facial rash       Etiology unclear. Appears more in sun-exposed skin. No clear sign of rosacea. I will refer to dermatology for an assessment.   Relevant Orders   Ambulatory  referral to Dermatology       Return in about 6 months (around 05/07/2024) for Reassessment.   Garnette CHRISTELLA Simpler, MD

## 2023-11-08 NOTE — Assessment & Plan Note (Signed)
 Improved. Continue vilazodone  (Viibryd ) 20 mg daily.

## 2023-11-15 ENCOUNTER — Ambulatory Visit: Admitting: Psychology

## 2023-11-16 ENCOUNTER — Telehealth: Payer: Self-pay

## 2023-11-16 NOTE — Telephone Encounter (Signed)
 Patient notified VIA phone that provider is out of the office and is okay with waiting till he returns to get an answer.  Will forward to provider then. Dm/cma

## 2023-11-16 NOTE — Telephone Encounter (Signed)
 Copied from CRM [id number]. Topic: Clinical - Medication Question >> Nov 16, 2023 12:31 PM Thersia BROCKS wrote: Reason for CRM: Patient called in regarding prescription  VIIBRYD  20 MG TABS to 30MG S would like for it to be increased and sent to  [facility] #[id number] - [place], [place] - [address] AT [facility] [address] & [address] [place] [address] Phone: [phone] Fax: [phone]

## 2023-11-21 NOTE — Telephone Encounter (Signed)
Please review and advise. Thanks. Dm/cma  

## 2023-11-22 ENCOUNTER — Ambulatory Visit: Payer: Self-pay

## 2023-11-22 NOTE — Telephone Encounter (Signed)
 FYI Only or Action Required?: Action required by provider: clinical question for provider and update on patient condition.  Patient was last seen in primary care on 11/08/2023 by Gary Jennings HERO, MD.  Called Nurse Triage reporting Anxiety.  Symptoms began about a month ago.  Interventions attempted: Prescription medications: Viibryd .  Symptoms are: gradually worsening.  Triage Disposition: Home Care  Patient/caregiver understands and will follow disposition?:   Copied from CRM [id number]. Topic: Clinical - Red Word Triage >> Nov 22, 2023  9:34 AM Frederich PARAS wrote: [facility] that prompted transfer to Nurse Triage: worsening on dt  Pt wants to schedule an apt with dr Gary to change medication, but he was adv by pcp that he needed to see him 1st. However, pt was denied scheduling due to him saying his anxiety was worsening and directed to NT Reason for Disposition  MILD anxiety symptoms (e.g., anxiety symptoms are mild and intermittent; symptoms do not interfere with daily activities)  Answer Assessment - Initial Assessment Questions Patient has had increased anxiety over the past month stating that he is experiencing more ups and downs. He thought that he was just in a funk, but realizes now that his anxiety is not as well managed.  States that this was discussed at his last office visit and that Dr. Thedora offered to increased his dose of Viibryd , but patient elected to stay at same dosage He has reconsidered and would like to increased the dosage.   No appointment available until 12/28/23.  Please reach out to patient if appointment is needed  1. CONCERN: Did anything happen that prompted you to call today?      Medicine not as effective as it was when he first started taking it 2. ANXIETY SYMPTOMS: Can you describe how you (your loved one; patient) have been feeling? (e.g., tense, restless, panicky, anxious, keyed up, overwhelmed, sense of impending doom).      Symptoms worsening 3.  ONSET: How long have you been feeling this way? (e.g., hours, days, weeks)     About a month ago 4. SEVERITY: How would you rate the level of anxiety? (e.g., 0 - 10; or mild, moderate, severe).     4/10 5. FUNCTIONAL IMPAIRMENT: How have these feelings affected your ability to do daily activities? Have you had more difficulty than usual doing your normal daily activities? (e.g., getting better, same, worse; self-care, school, work, interactions)     No impairment  Protocols used: Anxiety and Panic Attack-A-AH

## 2023-11-22 NOTE — Telephone Encounter (Signed)
 Called and scheduled him for a virtual appointment on 10/25/23 @8 :20 am. Dm/cma

## 2023-11-25 ENCOUNTER — Encounter: Payer: Self-pay | Admitting: Family Medicine

## 2023-11-25 ENCOUNTER — Telehealth: Admitting: Family Medicine

## 2023-11-25 DIAGNOSIS — F411 Generalized anxiety disorder: Secondary | ICD-10-CM | POA: Diagnosis not present

## 2023-11-25 DIAGNOSIS — F902 Attention-deficit hyperactivity disorder, combined type: Secondary | ICD-10-CM | POA: Diagnosis not present

## 2023-11-25 MED ORDER — AMPHETAMINE-DEXTROAMPHETAMINE 20 MG PO TABS: 20.0000 mg | ORAL_TABLET | Freq: Two times a day (BID) | ORAL | 0 refills | Status: DC

## 2023-11-25 MED ORDER — VIIBRYD 20 MG PO TABS
30.0000 mg | ORAL_TABLET | Freq: Every day | ORAL | 3 refills | Status: AC
Start: 2023-11-25 — End: ?

## 2023-11-25 NOTE — Assessment & Plan Note (Signed)
 Renew Adderall 20 mg bid.

## 2023-11-25 NOTE — Assessment & Plan Note (Signed)
 It is reasonable to increase the dose of Gary Jennings vilazodone  (Viibryd ) to 20 mg daily.

## 2023-11-25 NOTE — Progress Notes (Signed)
 [facility] [address] [place] [address] Dept: [phone] Dept Fax: [phone]  Virtual Video Visit  I connected with Ester RAMAN Mcartor on 11/25/23 at  8:20 AM EDT by a video enabled telemedicine application and verified that I am speaking with the correct person using two identifiers.  Location patient: Home Location provider: Clinic Persons participating in the virtual visit: Patient, Provider  I discussed the limitations of evaluation and management by telemedicine and the availability of in person appointments. The patient expressed understanding and agreed to proceed.  Chief Complaint  Patient presents with   Anxiety    Discuss change in medication      SUBJECTIVE:  HPI: Gary Jennings is a 33 y.o. male who presents to discuss his medication for anxiety. We restarted him on vilazodone  (Viibryd ) 20 mg daily this summer. He feels his anxiety is improved, but is still noticing negative thoughts creeping in on him. He was previously on 30 mg daily with this medicine and feels that dose worked better for him. He has noted some diarrhea at times, which he also had previously with the medicine. He feels this is manageable.   Patient Active Problem List   Diagnosis Date Noted   Seborrhea 12/09/2022   At risk for sleep apnea 01/28/2022   Class 1 obesity due to excess calories without serious comorbidity with body mass index (BMI) of 32.0 to 32.9 in adult 01/28/2022   Penile lesion 07/16/2021   Tinea capitis 07/16/2021   Patellar tendinitis, left knee 06/23/2020   Loose body in knee, right knee 06/23/2020   Generalized anxiety disorder 09/06/2016   GERD 01/03/2008   Attention deficit disorder 04/17/2007   History reviewed. No pertinent surgical history. Family History  Problem Relation Age of Onset   COPD Father    Stroke Maternal Grandfather    Heart disease Maternal Grandfather    Stroke Paternal Grandfather     Social History   Tobacco Use   Smoking status: Never   Smokeless tobacco: Former  Building services engineer status: Never Used  Substance Use Topics   Alcohol use: Yes    Comment: social   Drug use: No    Current Outpatient Medications:    albuterol  (VENTOLIN  HFA) 108 (90 Base) MCG/ACT inhaler, Inhale 2 puffs into the lungs 15 min. prior to exercise, Disp: 8 g, Rfl: 2   Selenium  Sulfide 2.25 % SHAM, Use as directed on scalp 2-3 times a week, Disp: 180 mL, Rfl: 11   triamcinolone  cream (KENALOG ) 0.1 %, Apply 1 Application topically 2 (two) times daily., Disp: 30 g, Rfl: 0   amphetamine -dextroamphetamine  (ADDERALL) 20 MG tablet, Take 1 tablet (20 mg total) by mouth 2 (two) times daily., Disp: 60 tablet, Rfl: 0   VIIBRYD  20 MG TABS, Take 1.5 tablets (30 mg total) by mouth daily., Disp: 135 tablet, Rfl: 3 Allergies  Allergen Reactions   Lexapro  [Escitalopram  Oxalate]     Suicidal thoughts   Ceftin  [Cefuroxime  Axetil]     n/v   Wellbutrin  [Bupropion ]     Anxiety, crying spells   ROS: See pertinent positives and negatives per HPI.  OBSERVATIONS/OBJECTIVE:  VITALS per patient if applicable: Today's Vitals   11/25/23 0807  Weight: 197 lb (89.4 kg)  Height: 5' 8 (1.727 m)   Body mass index is 29.95 kg/m.   GENERAL: Alert and oriented. Appears well and in no acute distress. PSYCH/NEURO: Pleasant and cooperative. No obvious depression or anxiety. Speech  and thought processing grossly intact.  ASSESSMENT AND PLAN:  Problem List Items Addressed This Visit       Other   Attention deficit disorder   Renew Adderall 20 mg bid.      Relevant Medications   amphetamine -dextroamphetamine  (ADDERALL) 20 MG tablet   Generalized anxiety disorder   It is reasonable to increase the dose of Mr. Arther vilazodone  (Viibryd ) to 20 mg daily.       Relevant Medications   VIIBRYD  20 MG TABS     I discussed the assessment and treatment plan with the patient. The patient was provided an  opportunity to ask questions and all were answered. The patient agreed with the plan and demonstrated an understanding of the instructions.   The patient was advised to call back or seek an in-person evaluation if the symptoms worsen or if the condition fails to improve as anticipated.  Return in about 6 weeks (around 01/06/2024) for Reassessment.   Garnette CHRISTELLA Simpler, MD

## 2023-11-28 ENCOUNTER — Other Ambulatory Visit (HOSPITAL_COMMUNITY): Payer: Self-pay

## 2023-11-28 ENCOUNTER — Telehealth: Payer: Self-pay

## 2023-11-28 NOTE — Telephone Encounter (Signed)
 Pharmacy Patient Advocate Encounter   Received notification from Onbase that prior authorization for Viibryd  20 mg tablets is required/requested.   Insurance verification completed.   The patient is insured through [employer] .   Per test claim: PA required; PA submitted to above mentioned insurance via Phone Key/confirmation #/EOC [id number] Status is pending  *determination will be within 3 business days according to representative I spoke with

## 2023-11-28 NOTE — Telephone Encounter (Signed)
 Additional information has been requested from the patient's insurance in order to proceed with the prior authorization request. Requested information has been sent, or form has been filled out and faxed back to [phone]

## 2023-11-29 ENCOUNTER — Other Ambulatory Visit (HOSPITAL_COMMUNITY): Payer: Self-pay

## 2023-11-30 ENCOUNTER — Other Ambulatory Visit (HOSPITAL_COMMUNITY): Payer: Self-pay

## 2023-11-30 NOTE — Telephone Encounter (Signed)
 Pharmacy Patient Advocate Encounter  Received notification from [facility] that Prior Authorization for Viibryd  20 mg tablets  has been APPROVED from 11/28/23 to 11/27/24. Ran test claim, Copay is $55. This test claim was processed through [facility]- copay amounts may vary at other pharmacies due to pharmacy/plan contracts, or as the patient moves through the different stages of their insurance plan.   PA #/Case ID/Reference #:  [id number]    *LVM with Arloa Prior

## 2023-12-15 ENCOUNTER — Ambulatory Visit (INDEPENDENT_AMBULATORY_CARE_PROVIDER_SITE_OTHER): Admitting: Psychology

## 2023-12-15 DIAGNOSIS — F411 Generalized anxiety disorder: Secondary | ICD-10-CM

## 2023-12-15 NOTE — Progress Notes (Signed)
 Gary Jennings  Patient ID: Gary Jennings, MRN: [id number],    Date: 12/15/2023  Time Spent: 5:00pm-5:45pm   45 minutes   Treatment Type: Individual Therapy  Reported Symptoms: stress, anxiety  Mental Status Exam: Appearance:  Casual     Behavior: Appropriate  Motor: Normal  Speech/Language:  Normal Rate  Affect: Appropriate  Mood: normal  Thought process: normal  Thought content:   WNL  Sensory/Perceptual disturbances:   WNL  Orientation: oriented to person, place, time/date, and situation  Attention: Good  Concentration: Good  Memory: WNL  Fund of knowledge:  Good  Insight:   Good  Judgment:  Good  Impulse Control: Good   Risk Assessment: Danger to Self:  No Self-injurious Behavior: No Danger to Others: No Duty to Warn:no Physical Aggression / Violence:No  Access to Firearms a concern: No  Gang Involvement:No   Subjective: Pt present for face-to-face individual therapy via video.  Pt consents to telehealth video session and is aware of limitations and benefits of virtual sessions. Location of pt: home Location of therapist: home office.   Pt states things have been better the past couple of months.  His medical provider increased his antidepressant to 30 mg which has helped reduce pt's intrusive thoughts and anxiety.  Pt states he still has some intrusive thoughts at times but is working on managing them.   He still compares his girlfriend Gary Jennings to other women and at times gets fixated on features about her that he does not like.  He is realizing that these are irrational ocd thoughts and not thoughts he should attach to.   Pt and Gary Jennings have been together for 5 years and pt feels like he wants to be with her.   He feels like if he doesn't commit to her soon she may choose to walk away.  Pt is thinking about proposing to Gary Jennings in the next week or two.   He feels anxious about it.   Helped pt process his feelings and fears.   Provided supportive therapy.    Interventions: Cognitive Behavioral Therapy and Insight-Oriented  Diagnosis:  F41.1  Plan of Care: Recommend ongoing therapy.   Pt participated in setting treatment goals.  Pt wants to improve coping skills and reduce anxiety and intrusive thoughts.  Plan to meet every two weeks.   Pt agrees with treatment plan. Treatment Plan Client Abilities/Strengths  Pt is bright, engaging and motivated for therapy.  Client Treatment Preferences  Individual therapy.  Client Statement of Needs  Improve coping skills.  Symptoms  Autonomic hyperactivity (e.g., palpitations, shortness of breath, dry mouth, trouble swallowing, nausea, diarrhea). Excessive and/or unrealistic worry that is difficult to control occurring more days than not for at least 6 months about a number of events or activities. Hypervigilance (e.g., feeling constantly on edge, experiencing concentration difficulties, having trouble falling or staying asleep, exhibiting a general state of irritability). Motor tension (e.g., restlessness, tiredness, shakiness, muscle tension). Problems Addressed  Anxiety Goals 1. Enhance ability to effectively cope with the full variety of life's worries and anxieties. 2. Learn and implement coping skills that result in a reduction of anxiety and worry, and improved daily functioning. Objective Learn to accept limitations in life and commit to tolerating, rather than avoiding, unpleasant emotions while accomplishing meaningful goals. Target Date: 2024-06-30 Frequency: Biweekly Progress: 10 Modality: individual Related Interventions 1. Use techniques from Acceptance and Commitment Therapy to help client accept uncomfortable realities such as lack of complete control, imperfections, and  uncertainty and tolerate unpleasant emotions and thoughts in order to accomplish value-consistent goals. Objective Learn and implement problem-solving strategies for realistically addressing  worries. Target Date: 2024-06-30 Frequency: Biweekly Progress: 10 Modality: individual Related Interventions 1. Assign the client a homework exercise in which he/she problem-solves a current problem.  review, reinforce success, and provide corrective feedback toward improvement. 2. Teach the client problem-solving strategies involving specifically defining a problem, generating options for addressing it, evaluating the pros and cons of each option, selecting and implementing an optional action, and reevaluating and refining the action. Objective Learn and implement calming skills to reduce overall anxiety and manage anxiety symptoms. Target Date: 2024-06-30 Frequency: Biweekly Progress: 10 Modality: individual Related Interventions 1. Assign the client to read about progressive muscle relaxation and other calming strategies in relevant books or treatment manuals (e.g., Progressive Relaxation Training by Thornell and Elmer; Mastery of Your Anxiety and Worry: Workbook by Richarda armin Given). 2. Assign the client homework each session in which he/she practices relaxation exercises daily, gradually applying them progressively from non-anxiety-provoking to anxiety-provoking situations; review and reinforce success while providing corrective feedback toward improvement. 3. Teach the client calming/relaxation skills (e.g., applied relaxation, progressive muscle relaxation, cue controlled relaxation; mindful breathing; biofeedback) and how to discriminate better between relaxation and tension; teach the client how to apply these skills to his/her daily life. 3. Reduce overall frequency, intensity, and duration of the anxiety so that daily functioning is not impaired. 4. Resolve the core conflict that is the source of anxiety. 5. Stabilize anxiety level while increasing ability to function on a daily basis. Diagnosis :    F41.1  Generalized Anxiety Disorder  Conditions For Discharge Achievement of  treatment goals and objectives.  Kweli Grassel, LCSW

## 2023-12-22 ENCOUNTER — Other Ambulatory Visit: Payer: Self-pay | Admitting: Family Medicine

## 2023-12-22 DIAGNOSIS — F902 Attention-deficit hyperactivity disorder, combined type: Secondary | ICD-10-CM

## 2023-12-22 MED ORDER — AMPHETAMINE-DEXTROAMPHETAMINE 20 MG PO TABS: 20.0000 mg | ORAL_TABLET | Freq: Two times a day (BID) | ORAL | 0 refills | Status: DC

## 2023-12-22 NOTE — Telephone Encounter (Unsigned)
 Copied from CRM #[id number]. Topic: Clinical - Medication Refill >> Dec 22, 2023 12:22 PM Shereese L wrote: Medication: amphetamine -dextroamphetamine  (ADDERALL) 20 MG tablet  Has the patient contacted their pharmacy? Yes (Agent: If no, request that the patient contact the pharmacy for the refill. If patient does not wish to contact the pharmacy document the reason why and proceed with request.) (Agent: If yes, when and what did the pharmacy advise?)  This is the patient's preferred pharmacy:    [facility] [id number] [place], [place] - [address] [place] [address] Phone: [phone] Fax: [phone]  Is this the correct pharmacy for this prescription? Yes If no, delete pharmacy and type the correct one.   Has the prescription been filled recently? Yes  Is the patient out of the medication? Yes  Has the patient been seen for an appointment in the last year OR does the patient have an upcoming appointment? Yes  Can we respond through MyChart? Yes  Agent: Please be advised that Rx refills may take up to 3 business days. We ask that you follow-up with your pharmacy.

## 2024-01-20 ENCOUNTER — Other Ambulatory Visit: Payer: Self-pay | Admitting: Family Medicine

## 2024-01-20 DIAGNOSIS — F902 Attention-deficit hyperactivity disorder, combined type: Secondary | ICD-10-CM

## 2024-01-20 MED ORDER — AMPHETAMINE-DEXTROAMPHETAMINE 20 MG PO TABS
20.0000 mg | ORAL_TABLET | Freq: Two times a day (BID) | ORAL | 0 refills | Status: AC
Start: 2024-01-20 — End: ?

## 2024-01-20 NOTE — Telephone Encounter (Signed)
 Requesting: amphetamine -dextroamphetamine  (ADDERALL) 20 MG tablet  Last Visit: 11/08/2023 Next Visit: Visit date not found Last Refill: 12/22/2023  Please Advise

## 2024-01-20 NOTE — Telephone Encounter (Signed)
 Copied from CRM [id number]. Topic: Clinical - Medication Refill >> Jan 20, 2024 10:18 AM Drema MATSU wrote: Medication: amphetamine -dextroamphetamine  (ADDERALL) 20 MG tablet  Has the patient contacted their pharmacy? Yes (Agent: If no, request that the patient contact the pharmacy for the refill. If patient does not wish to contact the pharmacy document the reason why and proceed with request.) no prescription call provider  (Agent: If yes, when and what did the pharmacy advise?)  This is the patient's preferred pharmacy:   [facility] [id number] [place], [place] - [address] [place] [address] Phone: [phone] Fax: [phone]  Is this the correct pharmacy for this prescription? Yes If no, delete pharmacy and type the correct one.   Has the prescription been filled recently? Yes  Is the patient out of the medication? Yes  Has the patient been seen for an appointment in the last year OR does the patient have an upcoming appointment? Yes  Can we respond through MyChart? Yes  Agent: Please be advised that Rx refills may take up to 3 business days. We ask that you follow-up with your pharmacy.

## 2024-02-08 ENCOUNTER — Ambulatory Visit: Admitting: Psychology

## 2024-02-08 DIAGNOSIS — F411 Generalized anxiety disorder: Secondary | ICD-10-CM

## 2024-02-08 NOTE — Progress Notes (Signed)
 [facility] Counselor/Therapist Progress Note  Patient ID: CELVIN TANEY, MRN: [id number],    Date: 02/08/2024  Time Spent: 5:00pm-5:45pm   45 minutes   Treatment Type: Individual Therapy  Reported Symptoms: stress, anxiety  Mental Status Exam: Appearance:  Casual     Behavior: Appropriate  Motor: Normal  Speech/Language:  Normal Rate  Affect: Appropriate  Mood: normal  Thought process: normal  Thought content:   WNL  Sensory/Perceptual disturbances:   WNL  Orientation: oriented to person, place, time/date, and situation  Attention: Good  Concentration: Good  Memory: WNL  Fund of knowledge:  Good  Insight:   Good  Judgment:  Good  Impulse Control: Good   Risk Assessment: Danger to Self:  No Self-injurious Behavior: No Danger to Others: No Duty to Warn:no Physical Aggression / Violence:No  Access to Firearms a concern: No  Gang Involvement:No   Subjective: Pt present for face-to-face individual therapy via video.  Pt consents to telehealth video session and is aware of limitations and benefits of virtual sessions. Location of pt: home Location of therapist: home office.   Pt talked about work.  He has interviewed for another job that would double his salary.   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  Pt has to work out the issues with his current non compete clause and if that works out he may start his new job February 1st.  Pt talked about his relationship with [name].  Pt has not proposed bc he is not sure he wants to be with her.  He stays very indecisive and is having trouble committing.  [name] is getting tired of waiting and has suggested a trial separation.   They are both not very happy with each other.   Pt is considering the trial separation and plans to talk to [name] about it. Pt feels anxious about making the wrong decision.  Helped pt process his feelings and fears.  Worked on designer, multimedia.   Provided supportive therapy.    Interventions: Cognitive Behavioral Therapy and Insight-Oriented  Diagnosis:  F41.1  Plan of Care: Recommend ongoing therapy.   Pt participated in setting treatment goals.  Pt wants to improve coping skills and reduce anxiety and intrusive thoughts.  Plan to meet every two weeks.   Pt agrees with treatment plan. Treatment Plan Client Abilities/Strengths  Pt is bright, engaging and motivated for therapy.  Client Treatment Preferences  Individual therapy.  Client Statement of Needs  Improve coping skills.  Symptoms  Autonomic hyperactivity (e.g., palpitations, shortness of breath, dry mouth, trouble swallowing, nausea, diarrhea). Excessive and/or unrealistic worry that is difficult to control occurring more days than not for at least 6 months about a number of events or activities. Hypervigilance (e.g., feeling constantly on edge, experiencing concentration difficulties, having trouble falling or staying asleep, exhibiting a general state of irritability). Motor tension (e.g., restlessness, tiredness, shakiness, muscle tension). Problems Addressed  Anxiety Goals 1. Enhance ability to effectively cope with the full variety of life's worries and anxieties. 2. Learn and implement coping skills that result in a reduction of anxiety and worry, and improved daily functioning. Objective Learn to accept limitations in life and commit to tolerating, rather than avoiding, unpleasant emotions while accomplishing meaningful goals. Target Date: 2024-06-30 Frequency: Biweekly Progress: 10 Modality: individual Related Interventions 1. Use techniques from Acceptance and Commitment Therapy to help client accept uncomfortable realities such as lack of complete control, imperfections,  and uncertainty and tolerate unpleasant emotions and thoughts in order to accomplish value-consistent goals. Objective Learn and implement problem-solving strategies for realistically addressing  worries. Target Date: 2024-06-30 Frequency: Biweekly Progress: 10 Modality: individual Related Interventions 1. Assign the client a homework exercise in which he/she problem-solves a current problem.  review, reinforce success, and provide corrective feedback toward improvement. 2. Teach the client problem-solving strategies involving specifically defining a problem, generating options for addressing it, evaluating the pros and cons of each option, selecting and implementing an optional action, and reevaluating and refining the action. Objective Learn and implement calming skills to reduce overall anxiety and manage anxiety symptoms. Target Date: 2024-06-30 Frequency: Biweekly Progress: 10 Modality: individual Related Interventions 1. Assign the client to read about progressive muscle relaxation and other calming strategies in relevant books or treatment manuals (e.g., Progressive Relaxation Training by Thornell and Elmer; Mastery of Your Anxiety and Worry: Workbook by Richarda armin Given). 2. Assign the client homework each session in which he/she practices relaxation exercises daily, gradually applying them progressively from non-anxiety-provoking to anxiety-provoking situations; review and reinforce success while providing corrective feedback toward improvement. 3. Teach the client calming/relaxation skills (e.g., applied relaxation, progressive muscle relaxation, cue controlled relaxation; mindful breathing; biofeedback) and how to discriminate better between relaxation and tension; teach the client how to apply these skills to his/her daily life. 3. Reduce overall frequency, intensity, and duration of the anxiety so that daily functioning is not impaired. 4. Resolve the core conflict that is the source of anxiety. 5. Stabilize anxiety level while increasing ability to function on a daily basis. Diagnosis :    F41.1  Generalized Anxiety Disorder  Conditions For Discharge Achievement of  treatment goals and objectives.  Shatha Hooser, LCSW

## 2024-02-17 ENCOUNTER — Other Ambulatory Visit: Payer: Self-pay | Admitting: Family Medicine

## 2024-02-17 DIAGNOSIS — F902 Attention-deficit hyperactivity disorder, combined type: Secondary | ICD-10-CM

## 2024-02-17 MED ORDER — AMPHETAMINE-DEXTROAMPHETAMINE 20 MG PO TABS: 20.0000 mg | ORAL_TABLET | Freq: Two times a day (BID) | ORAL | 0 refills | Status: DC

## 2024-02-17 NOTE — Telephone Encounter (Signed)
 Requesting: amphetamine -dextroamphetamine  (ADDERALL) 20 MG tablet  Last Visit: 11/08/2023 Next Visit: Visit date not found Last Refill: 01/20/2024  Please Advise

## 2024-02-17 NOTE — Telephone Encounter (Signed)
 Copied from CRM #[id number]. Topic: Clinical - Medication Refill >> Feb 17, 2024  1:20 PM Franky GRADE wrote: Medication: amphetamine -dextroamphetamine  (ADDERALL) 20 MG tablet [[id number]]  Has the patient contacted their pharmacy? No (Agent: If no, request that the patient contact the pharmacy for the refill. If patient does not wish to contact the pharmacy document the reason why and proceed with request.) (Agent: If yes, when and what did the pharmacy advise?)  This is the patient's preferred pharmacy:    [facility] [id number] [place], [place] - [address] [place] [address] Phone: [phone] Fax: [phone]  Is this the correct pharmacy for this prescription? Yes If no, delete pharmacy and type the correct one.   Has the prescription been filled recently? No  Is the patient out of the medication? Yes  Has the patient been seen for an appointment in the last year OR does the patient have an upcoming appointment? Yes  Can we respond through MyChart? Yes  Agent: Please be advised that Rx refills may take up to 3 business days. We ask that you follow-up with your pharmacy.

## 2024-03-19 ENCOUNTER — Other Ambulatory Visit: Payer: Self-pay

## 2024-03-19 ENCOUNTER — Telehealth: Payer: Self-pay | Admitting: Family Medicine

## 2024-03-19 DIAGNOSIS — F902 Attention-deficit hyperactivity disorder, combined type: Secondary | ICD-10-CM

## 2024-03-19 MED ORDER — AMPHETAMINE-DEXTROAMPHETAMINE 20 MG PO TABS: 20.0000 mg | ORAL_TABLET | Freq: Two times a day (BID) | ORAL | 0 refills | Status: AC

## 2024-03-19 NOTE — Telephone Encounter (Signed)
 See other message regarding refill. Dm/cma

## 2024-03-19 NOTE — Telephone Encounter (Signed)
 Copied from CRM [id number]. Topic: Clinical - Medication Refill >> Mar 19, 2024  1:42 PM Suzen RAMAN wrote: Medicationamphetamine-dextroamphetamine  (ADDERALL) 20 MG tablet  Has the patient contacted their pharmacy? Yes   This is the patient's preferred pharmacy:   [facility] [id number] [place], [place] - [address] [place] [address] Phone: [phone] Fax: [phone]  Is this the correct pharmacy for this prescription? Yes If no, delete pharmacy and type the correct one.   Has the prescription been filled recently? No  Is the patient out of the medication? Yes  Has the patient been seen for an appointment in the last year OR does the patient have an upcoming appointment? Yes  Can we respond through MyChart? Yes  Agent: Please be advised that Rx refills may take up to 3 business days. We ask that you follow-up with your pharmacy.

## 2024-03-19 NOTE — Telephone Encounter (Signed)
 Refill request for Adderall 20 mg LR 02/17/24, #30, 0 rf LOV 11/08/23 FOV  none scheduled  Please review and advise.   Thanks. Dm/cma

## 2024-03-19 NOTE — Telephone Encounter (Signed)
 Copied from CRM [id number]. Topic: Clinical - Medication Refill >> Mar 19, 2024  1:51 PM Suzen RAMAN wrote: Medication: amphetamine -dextroamphetamine  (ADDERALL) 20 MG tablet   Has the patient contacted their pharmacy? Yes   This is the patient's preferred pharmacy:  [facility] [id number] [place], [place] - [address] [place] [address] Phone: [phone] Fax: [phone]  Is this the correct pharmacy for this prescription? Yes If no, delete pharmacy and type the correct one.   Has the prescription been filled recently? No  Is the patient out of the medication? Yes  Has the patient been seen for an appointment in the last year OR does the patient have an upcoming appointment? Yes  Can we respond through MyChart? Yes  Agent: Please be advised that Rx refills may take up to 3 business days. We ask that you follow-up with your pharmacy.

## 2024-04-05 ENCOUNTER — Ambulatory Visit: Payer: Self-pay | Admitting: Psychology
